# Patient Record
Sex: Female | Born: 1995 | Race: Black or African American | Hispanic: No | Marital: Married | Smoking: Former smoker
Health system: Southern US, Community
[De-identification: ages and names within clinical notes are randomized; demographics above are authoritative.]

## PROBLEM LIST (undated history)

## (undated) DIAGNOSIS — J302 Other seasonal allergic rhinitis: Secondary | ICD-10-CM

## (undated) DIAGNOSIS — Z789 Other specified health status: Secondary | ICD-10-CM

## (undated) HISTORY — PX: APPENDECTOMY: SHX54

---

## 2013-04-17 ENCOUNTER — Encounter (HOSPITAL_COMMUNITY): Payer: Self-pay | Admitting: Neurology

## 2013-04-17 ENCOUNTER — Emergency Department (HOSPITAL_COMMUNITY)
Admission: EM | Admit: 2013-04-17 | Discharge: 2013-04-17 | Disposition: A | Discharge status: Home / Self Care | Payer: Medicaid Other | Attending: Emergency Medicine | Admitting: Emergency Medicine

## 2013-04-17 DIAGNOSIS — F17200 Nicotine dependence, unspecified, uncomplicated: Secondary | ICD-10-CM | POA: Insufficient documentation

## 2013-04-17 DIAGNOSIS — R21 Rash and other nonspecific skin eruption: Secondary | ICD-10-CM | POA: Insufficient documentation

## 2013-04-17 DIAGNOSIS — R51 Headache: Secondary | ICD-10-CM | POA: Insufficient documentation

## 2013-04-17 DIAGNOSIS — L299 Pruritus, unspecified: Secondary | ICD-10-CM | POA: Insufficient documentation

## 2013-04-17 DIAGNOSIS — Z79899 Other long term (current) drug therapy: Secondary | ICD-10-CM | POA: Insufficient documentation

## 2013-04-17 DIAGNOSIS — J3489 Other specified disorders of nose and nasal sinuses: Secondary | ICD-10-CM | POA: Insufficient documentation

## 2013-04-17 HISTORY — DX: Other seasonal allergic rhinitis: J30.2

## 2013-04-17 MED ORDER — PREDNISONE 20 MG PO TABS: 40.0000 mg | ORAL_TABLET | Freq: Every day | ORAL | Status: DC

## 2013-04-17 MED ORDER — DIPHENHYDRAMINE HCL 25 MG PO TABS: 25.0000 mg | ORAL_TABLET | Freq: Four times a day (QID) | ORAL | Status: DC | PRN

## 2013-04-17 MED ORDER — FAMOTIDINE 20 MG PO TABS: 20.0000 mg | ORAL_TABLET | Freq: Two times a day (BID) | ORAL | Status: DC

## 2013-04-17 MED ORDER — CETIRIZINE-PSEUDOEPHEDRINE ER 5-120 MG PO TB12: 1.0000 | ORAL_TABLET | Freq: Two times a day (BID) | ORAL | Status: DC

## 2013-04-17 NOTE — ED Provider Notes (Signed)
Medical screening examination/treatment/procedure(s) were performed by non-physician practitioner and as supervising physician I was immediately available for consultation/collaboration.   Junius Argyle, MD 04/17/13 [phone]

## 2013-04-17 NOTE — ED Notes (Signed)
Pt reporting hives to arms and legs. Also stuffy nose, productive cough, ear pain since spring along with sinus headache. Reports moved into new apartment last week. Pt is a x 4. NAD.

## 2013-04-17 NOTE — ED Provider Notes (Signed)
CSN: [id number]     Arrival date & time 04/17/13  3299 History   First MD Initiated Contact with Patient 04/17/13 0830     Chief Complaint  Patient presents with  . Urticaria   (Consider location/radiation/quality/duration/timing/severity/associated sxs/prior Treatment) HPI Comments: Patient is a 17 year old female who presents for rash x2 days. Patient states the rash is sporadic and present on her bilateral extremities. Patient has tried Benadryl for symptoms without relief. She states the rash is pruritic. Patient denies any aggravating factors of her symptoms. Patient states that she has a history of rashes like this in the past, but that "it hasn't happened for a while". She states that usually the rash resolves spontaneously without treatment. Patient denies recent antibiotic use or new detergents, creams, lotions, or food exposures. Patient further denies associated fever, inability to swallow, shortness of breath, nausea or vomiting, or numbness/tingling. Patient also complaining of nasal congestion with intermittent cough and sinus headache x3 months. Patient states she has seen her primary care provider for the symptoms and was told she has allergies. She has been taking loratadine for symptoms with no relief.  Patient is a 17 y.o. female presenting with urticaria. The history is provided by the patient. No language interpreter was used.  Urticaria Associated symptoms include congestion, headaches and a rash. Pertinent negatives include no fever, nausea or vomiting.    Past Medical History  Diagnosis Date  . Seasonal allergies    Past Surgical History  Procedure Laterality Date  . Appendectomy     No family history on file. History  Substance Use Topics  . Smoking status: Current Every Day Smoker  . Smokeless tobacco: Not on file  . Alcohol Use: Yes   OB History   Grav Para Term Preterm Abortions TAB SAB Ect Mult Living                 Review of Systems  Constitutional:  Negative for fever.  HENT: Positive for congestion. Negative for rhinorrhea, drooling and trouble swallowing.   Respiratory: Negative for shortness of breath.   Gastrointestinal: Negative for nausea and vomiting.  Skin: Positive for rash.  Neurological: Positive for headaches.  All other systems reviewed and are negative.    Allergies  Review of patient's allergies indicates no known allergies.  Home Medications   Current Outpatient Rx  Name  Route  Sig  Dispense  Refill  . diphenhydrAMINE (SOMINEX) 25 MG tablet   Oral   Take 25 mg by mouth at bedtime as needed for sleep.         Marland Kitchen ibuprofen (ADVIL,MOTRIN) 200 MG tablet   Oral   Take 400 mg by mouth every 6 (six) hours as needed for pain.         Marland Kitchen loratadine (CLARITIN) 10 MG tablet   Oral   Take 10 mg by mouth daily.         . pseudoephedrine (SUDAFED) 60 MG tablet   Oral   Take 60 mg by mouth every 4 (four) hours as needed for congestion.         . cetirizine-pseudoephedrine (ZYRTEC-D) 5-120 MG per tablet   Oral   Take 1 tablet by mouth 2 (two) times daily.   30 tablet   0   . diphenhydrAMINE (BENADRYL) 25 MG tablet   Oral   Take 1 tablet (25 mg total) by mouth every 6 (six) hours as needed for itching (Rash).   30 tablet   0   . famotidine (  PEPCID) 20 MG tablet   Oral   Take 1 tablet (20 mg total) by mouth 2 (two) times daily.   30 tablet   0   . predniSONE (DELTASONE) 20 MG tablet   Oral   Take 2 tablets (40 mg total) by mouth daily.   10 tablet   0    BP 115/68  Pulse 90  Temp(Src) 98.7 F (37.1 C) (Oral)  Resp 20  Ht 5\' 9"  (1.753 m)  Wt 233 lb (105.688 kg)  BMI 34.39 kg/m2  SpO2 100%  LMP 03/29/2013 Physical Exam  Nursing note and vitals reviewed. Constitutional: She is oriented to person, place, and time. She appears well-developed and well-nourished. No distress.  HENT:  Head: Normocephalic and atraumatic.  Right Ear: Tympanic membrane, external ear and ear canal normal. No  mastoid tenderness.  Left Ear: Tympanic membrane, external ear and ear canal normal. No mastoid tenderness.  Nose: Nose normal.  Mouth/Throat: Uvula is midline, oropharynx is clear and moist and mucous membranes are normal. No oral lesions. No edematous. No oropharyngeal exudate or posterior oropharyngeal edema.  Eyes: Conjunctivae and EOM are normal. Pupils are equal, round, and reactive to light. No scleral icterus.  Neck: Normal range of motion. Neck supple.  Cardiovascular: Normal rate, regular rhythm, normal heart sounds and intact distal pulses.   Pulmonary/Chest: Effort normal and breath sounds normal. No stridor. No respiratory distress. She has no wheezes. She has no rales.  Musculoskeletal: Normal range of motion.  Neurological: She is alert and oriented to person, place, and time.  Skin: Skin is warm and dry. Rash noted. She is not diaphoretic. No pallor.  Sporadic, slightly raised, blanching erythematous, macular rash present on heel of LLE, L inner thing, L elbow, and R hand. No vesicles, superficial scaling, weeping, dryness, or purulent drainage.  Psychiatric: She has a normal mood and affect. Her behavior is normal.    ED Course  Procedures (including critical care time) Labs Review Labs Reviewed - No data to display Imaging Review No results found.  MDM   1. Rash    Uncomplicated macular rash - Patient well and nontoxic appearing, hemodynamically stable, and afebrile. She denies any contact with new topical products or ingesting new foods. Denies recent abx use. Airway patent. Tolerating secretions without difficulty. Patient without tachycardia, tachypnea, or hypoxia; lungs CTAB. Rash nonconcerning for SJS, erythema multiforme major, or erythema multiforme minor. Patient appropriate for discharge with primary care followup for further evaluation of symptoms. Benadryl, Pepcid, and prednisone prescribed for pruritic rash. Zyrtec-D also prescribed as loratadine not helping  allergy symptoms and nasal congestion which have been persisting x 3 months. Indications for ED return discussed and patient agreeable to plan with no unaddressed concerns.    Antonietta Breach, PA-C 04/17/13 [phone]

## 2013-06-03 ENCOUNTER — Emergency Department (HOSPITAL_COMMUNITY)
Admission: EM | Admit: 2013-06-03 | Discharge: 2013-06-03 | Disposition: A | Discharge status: Home / Self Care | Payer: Medicaid Other | Attending: Emergency Medicine | Admitting: Emergency Medicine

## 2013-06-03 ENCOUNTER — Encounter (HOSPITAL_COMMUNITY): Payer: Self-pay | Admitting: Emergency Medicine

## 2013-06-03 DIAGNOSIS — F17200 Nicotine dependence, unspecified, uncomplicated: Secondary | ICD-10-CM | POA: Insufficient documentation

## 2013-06-03 DIAGNOSIS — Z79899 Other long term (current) drug therapy: Secondary | ICD-10-CM | POA: Insufficient documentation

## 2013-06-03 DIAGNOSIS — L0291 Cutaneous abscess, unspecified: Secondary | ICD-10-CM

## 2013-06-03 DIAGNOSIS — Z8709 Personal history of other diseases of the respiratory system: Secondary | ICD-10-CM | POA: Insufficient documentation

## 2013-06-03 DIAGNOSIS — L02419 Cutaneous abscess of limb, unspecified: Secondary | ICD-10-CM | POA: Insufficient documentation

## 2013-06-03 NOTE — ED Notes (Signed)
Started 1 week ago with "boil" on right inner thigh.

## 2013-06-03 NOTE — ED Provider Notes (Signed)
CSN: [id number]     Arrival date & time 06/03/13  1225 History  This chart was scribed for non-physician practitioner Junious Silk, PA-C, working with Shelda Jakes, MD by Dorothey Baseman, ED Scribe. This patient was seen in room TR11C/TR11C and the patient's care was started at 1:25 PM.    Chief Complaint  Patient presents with  . Recurrent Skin Infections   The history is provided by the patient. No language interpreter was used.   HPI Comments: Ann Ruiz is a 17 y.o. female who presents to the Emergency Department complaining of an abscess to the right inner thigh with an associated burning pain that has been progressively worsening onset 1 week ago. Patient denies history of prior abscesses. She denies fever, chills, and abdominal pain. Palpation makes her pain worse. Nothing makes her pain better. Patient denies any other pertinent medical history.   Past Medical History  Diagnosis Date  . Seasonal allergies    Past Surgical History  Procedure Laterality Date  . Appendectomy     No family history on file. History  Substance Use Topics  . Smoking status: Current Every Day Smoker  . Smokeless tobacco: Not on file  . Alcohol Use: Yes   OB History   Grav Para Term Preterm Abortions TAB SAB Ect Mult Living                 Review of Systems  Constitutional: Negative for fever and chills.  Gastrointestinal: Negative for abdominal pain.  Skin: Positive for wound ( abscess to inner right thigh).  All other systems reviewed and are negative.    Allergies  Review of patient's allergies indicates no known allergies.  Home Medications   Current Outpatient Rx  Name  Route  Sig  Dispense  Refill  . cetirizine-pseudoephedrine (ZYRTEC-D) 5-120 MG per tablet   Oral   Take 1 tablet by mouth 2 (two) times daily.   30 tablet   0   . ibuprofen (ADVIL,MOTRIN) 200 MG tablet   Oral   Take 400 mg by mouth every 6 (six) hours as needed for pain.         Marland Kitchen loratadine  (CLARITIN) 10 MG tablet   Oral   Take 10 mg by mouth daily.          Triage Vitals: BP 113/74  Pulse 89  Temp(Src) 97.3 F (36.3 C) (Oral)  SpO2 100%  LMP 05/20/2013  Physical Exam  Nursing note and vitals reviewed. Constitutional: She is oriented to person, place, and time. She appears well-developed and well-nourished. No distress.  HENT:  Head: Normocephalic and atraumatic.  Right Ear: External ear normal.  Left Ear: External ear normal.  Nose: Nose normal.  Mouth/Throat: Oropharynx is clear and moist.  Eyes: Conjunctivae are normal.  Neck: Normal range of motion.  Cardiovascular: Normal rate, regular rhythm and normal heart sounds.   Pulmonary/Chest: Effort normal and breath sounds normal. No stridor. No respiratory distress. She has no wheezes. She has no rales.  Abdominal: Soft. She exhibits no distension.  Musculoskeletal: Normal range of motion.  Neurological: She is alert and oriented to person, place, and time. She has normal strength.  Skin: Skin is warm and dry. She is not diaphoretic. No erythema.  3 cm area of fluctuance without surrounding erythema or streaking.   Psychiatric: She has a normal mood and affect. Her behavior is normal.    ED Course  Procedures (including critical care time)  DIAGNOSTIC STUDIES: Oxygen Saturation is 100%  on room air, normal by my interpretation.    COORDINATION OF CARE: 1:27 PM- Will perform an incision and drainage of the area. Advised patient to soak the area in clean water for 15 minutes, 3 times a day and to keep the area clean until symptoms subside. Discussed that antibiotics will not be necessary. Advised patient to follow up if there are any new or worsening symptoms. Discussed treatment plan with patient at bedside and patient verbalized agreement.   INCISION AND DRAINAGE Performed by: Junious Silk, PA-C Consent: Verbal consent obtained. Risks and benefits: risks, benefits and alternatives were discussed Type:  abscess Body area: inner right thigh Anesthesia: local infiltration Incision was made with a scalpel. Local anesthetic: lidocaine 2% with epinephrine Anesthetic total: 4 ml Complexity: simple Blunt dissection to break up loculations Drainage: purulent Drainage amount: moderate Patient tolerance: Patient tolerated the procedure well with no immediate complications.   Labs Review Labs Reviewed - No data to display Imaging Review No results found.  EKG Interpretation   None       MDM   1. Abscess    Patient with skin abscess amenable to incision and drainage.  Abscess was not large enough to warrant packing or drain,  wound recheck in 2 days. Encouraged home warm soaks and flushing.  Mild signs of cellulitis is surrounding skin.  Will d/c to home.  No antibiotic therapy is indicated.   I personally performed the services described in this documentation, which was scribed in my presence. The recorded information has been reviewed and is accurate.     Mora Bellman, PA-C 06/03/13 1535

## 2013-06-05 NOTE — ED Provider Notes (Signed)
Medical screening examination/treatment/procedure(s) were performed by non-physician practitioner and as supervising physician I was immediately available for consultation/collaboration.   Shelda Jakes, MD 06/05/13 1019

## 2013-09-19 ENCOUNTER — Emergency Department (HOSPITAL_COMMUNITY): Payer: Medicaid Other

## 2013-09-19 ENCOUNTER — Encounter (HOSPITAL_COMMUNITY): Payer: Self-pay | Admitting: Emergency Medicine

## 2013-09-19 ENCOUNTER — Emergency Department (HOSPITAL_COMMUNITY)
Admission: EM | Admit: 2013-09-19 | Discharge: 2013-09-19 | Disposition: A | Discharge status: Home / Self Care | Payer: Medicaid Other | Attending: Emergency Medicine | Admitting: Emergency Medicine

## 2013-09-19 DIAGNOSIS — Z3202 Encounter for pregnancy test, result negative: Secondary | ICD-10-CM | POA: Insufficient documentation

## 2013-09-19 DIAGNOSIS — F17200 Nicotine dependence, unspecified, uncomplicated: Secondary | ICD-10-CM | POA: Insufficient documentation

## 2013-09-19 DIAGNOSIS — K5900 Constipation, unspecified: Secondary | ICD-10-CM | POA: Insufficient documentation

## 2013-09-19 LAB — URINALYSIS, ROUTINE W REFLEX MICROSCOPIC
Bilirubin Urine: NEGATIVE
GLUCOSE, UA: NEGATIVE mg/dL
HGB URINE DIPSTICK: NEGATIVE
Ketones, ur: NEGATIVE mg/dL
LEUKOCYTES UA: NEGATIVE
Nitrite: NEGATIVE
Protein, ur: NEGATIVE mg/dL
SPECIFIC GRAVITY, URINE: 1.007 (ref 1.005–1.030)
UROBILINOGEN UA: 0.2 mg/dL (ref 0.0–1.0)
pH: 8 (ref 5.0–8.0)

## 2013-09-19 LAB — COMPREHENSIVE METABOLIC PANEL
ALBUMIN: 4.1 g/dL (ref 3.5–5.2)
ALT: 8 U/L (ref 0–35)
AST: 12 U/L (ref 0–37)
Alkaline Phosphatase: 97 U/L (ref 39–117)
BUN: 7 mg/dL (ref 6–23)
CALCIUM: 9.5 mg/dL (ref 8.4–10.5)
CO2: 23 mEq/L (ref 19–32)
CREATININE: 0.68 mg/dL (ref 0.50–1.10)
Chloride: 103 mEq/L (ref 96–112)
GFR calc Af Amer: >90 mL/min (ref >90)
GFR calc non Af Amer: >90 mL/min (ref >90)
Glucose, Bld: 94 mg/dL (ref 70–99)
Potassium: 3.9 mEq/L (ref 3.7–5.3)
Sodium: 142 mEq/L (ref 137–147)
Total Bilirubin: 0.3 mg/dL (ref 0.3–1.2)
Total Protein: 7.6 g/dL (ref 6.0–8.3)

## 2013-09-19 LAB — CBC WITH DIFFERENTIAL/PLATELET
BASOS ABS: 0 10*3/uL (ref 0.0–0.1)
BASOS PCT: 0 % (ref 0–1)
EOS ABS: 0.1 10*3/uL (ref 0.0–0.7)
EOS PCT: 1 % (ref 0–5)
HEMATOCRIT: 36.6 % (ref 36.0–46.0)
Hemoglobin: 12.6 g/dL (ref 12.0–15.0)
Lymphocytes Relative: 19 % (ref 12–46)
Lymphs Abs: 1.8 10*3/uL (ref 0.7–4.0)
MCH: 31.3 pg (ref 26.0–34.0)
MCHC: 34.4 g/dL (ref 30.0–36.0)
MCV: 90.8 fL (ref 78.0–100.0)
MONO ABS: 0.6 10*3/uL (ref 0.1–1.0)
Monocytes Relative: 6 % (ref 3–12)
NEUTROS ABS: 7 10*3/uL (ref 1.7–7.7)
Neutrophils Relative %: 73 % (ref 43–77)
Platelets: 238 10*3/uL (ref 150–400)
RBC: 4.03 MIL/uL (ref 3.87–5.11)
RDW: 13 % (ref 11.5–15.5)
WBC: 9.5 10*3/uL (ref 4.0–10.5)

## 2013-09-19 LAB — POCT PREGNANCY, URINE: PREG TEST UR: NEGATIVE

## 2013-09-19 NOTE — ED Notes (Signed)
Pt to xray at this time.

## 2013-09-19 NOTE — Discharge Instructions (Signed)
Constipation, Adult °Constipation is when a person has fewer than 3 bowel movements a week; has difficulty having a bowel movement; or has stools that are dry, hard, or larger than normal. As people grow older, constipation is more common. If you try to fix constipation with medicines that make you have a bowel movement (laxatives), the problem may get worse. Long-term laxative use may cause the muscles of the colon to become weak. A low-fiber diet, not taking in enough fluids, and taking certain medicines may make constipation worse. °CAUSES  °· Certain medicines, such as antidepressants, pain medicine, iron supplements, antacids, and water pills.   °· Certain diseases, such as diabetes, irritable bowel syndrome (IBS), thyroid disease, or depression.   °· Not drinking enough water.   °· Not eating enough fiber-rich foods.   °· Stress or travel. °· Lack of physical activity or exercise. °· Not going to the restroom when there is the urge to have a bowel movement. °· Ignoring the urge to have a bowel movement. °· Using laxatives too much. °SYMPTOMS  °· Having fewer than 3 bowel movements a week.   °· Straining to have a bowel movement.   °· Having hard, dry, or larger than normal stools.   °· Feeling full or bloated.   °· Pain in the lower abdomen. °· Not feeling relief after having a bowel movement. °DIAGNOSIS  °Your caregiver will take a medical history and perform a physical exam. Further testing may be done for severe constipation. Some tests may include:  °· A barium enema X-ray to examine your rectum, colon, and sometimes, your small intestine. °· A sigmoidoscopy to examine your lower colon. °· A colonoscopy to examine your entire colon. °TREATMENT  °Treatment will depend on the severity of your constipation and what is causing it. Some dietary treatments include drinking more fluids and eating more fiber-rich foods. Lifestyle treatments may include regular exercise. If these diet and lifestyle recommendations  do not help, your caregiver may recommend taking over-the-counter laxative medicines to help you have bowel movements. Prescription medicines may be prescribed if over-the-counter medicines do not work.  °HOME CARE INSTRUCTIONS  °· Increase dietary fiber in your diet, such as fruits, vegetables, whole grains, and beans. Limit high-fat and processed sugars in your diet, such as French fries, hamburgers, cookies, candies, and soda.   °· A fiber supplement may be added to your diet if you cannot get enough fiber from foods.   °· Drink enough fluids to keep your urine clear or pale yellow.   °· Exercise regularly or as directed by your caregiver.   °· Go to the restroom when you have the urge to go. Do not hold it. °· Only take medicines as directed by your caregiver. Do not take other medicines for constipation without talking to your caregiver first. °SEEK IMMEDIATE MEDICAL CARE IF:  °· You have bright red blood in your stool.   °· Your constipation lasts for more than 4 days or gets worse.   °· You have abdominal or rectal pain.   °· You have thin, pencil-like stools. °· You have unexplained weight loss. °MAKE SURE YOU:  °· Understand these instructions. °· Will watch your condition. °· Will get help right away if you are not doing well or get worse. °Document Released: 05/06/2004 Document Revised: 10/31/2011 Document Reviewed: 05/20/2013 °[employer]® Patient Information ©2014 [employer]. ° °Fiber Content in Foods °Drinking plenty of fluids and consuming foods high in fiber can help with constipation. See the list below for the fiber content of some common foods. °Starches and Grains / Dietary   Fiber (g) °· Cheerios, 1 cup / 3 g °· Kellogg's Corn Flakes, 1 cup / 0.7 g °· Rice Krispies, 1 ¼ cup / 0.3 g °· Quaker Oat Life Cereal, ¾ cup / 2.1 g °· Oatmeal, instant (cooked), ½ cup / 2 g °· Kellogg's Frosted Mini Wheats, 1 cup / 5.1 g °· Rice, brown, long-grain (cooked), 1 cup / 3.5 g °· Rice, white, long-grain (cooked),  1 cup / 0.6 g °· Macaroni, cooked, enriched, 1 cup / 2.5 g °Legumes / Dietary Fiber (g) °· Beans, baked, canned, plain or vegetarian, ½ cup / 5.2 g °· Beans, kidney, canned, ½ cup / 6.8 g °· Beans, pinto, dried (cooked), ½ cup / 7.7 g °· Beans, pinto, canned, ½ cup / 5.5 g °Breads and Crackers / Dietary Fiber (g) °· Graham crackers, plain or honey, 2 squares / 0.7 g °· Saltine crackers, 3 squares / 0.3 g °· Pretzels, plain, salted, 10 pieces / 1.8 g °· Bread, whole-wheat, 1 slice / 1.9 g °· Bread, white, 1 slice / 0.7 g °· Bread, raisin, 1 slice / 1.2 g °· Bagel, plain, 3 oz / 2 g °· Tortilla, flour, 1 oz / 0.9 g °· Tortilla, corn, 1 small / 1.5 g °· Bun, hamburger or hotdog, 1 small / 0.9 g °Fruits / Dietary Fiber (g) °· Apple, raw with skin, 1 medium / 4.4 g °· Applesauce, sweetened, ½ cup / 1.5 g °· Banana, ½ medium / 1.5 g °· Grapes, 10 grapes / 0.4 g °· Orange, 1 small / 2.3 g °· Raisin, 1.5 oz / 1.6 g °· Melon, 1 cup / 1.4 g °Vegetables / Dietary Fiber (g) °· Green beans, canned, ½ cup / 1.3 g °· Carrots (cooked), ½ cup / 2.3 g °· Broccoli (cooked), ½ cup / 2.8 g °· Peas, frozen (cooked), ½ cup / 4.4 g °· Potatoes, mashed, ½ cup / 1.6 g °· Lettuce, 1 cup / 0.5 g °· Corn, canned, ½ cup / 1.6 g °· Tomato, ½ cup / 1.1 g °Document Released: 12/25/2006 Document Revised: 10/31/2011 Document Reviewed: 02/19/2007 °ExitCare® Patient Information ©2014 [employer]. ° °

## 2013-09-19 NOTE — ED Notes (Signed)
Pt up to bathroom without any problems 

## 2013-09-19 NOTE — ED Notes (Signed)
Pt reports severe lower abd cramping that started today, having difficulty having bowel movement. Denies any urinary or vaginal symptoms. Denies n/v.

## 2013-09-19 NOTE — ED Provider Notes (Signed)
TIME SEEN: 4:56 PM  CHIEF COMPLAINT: Abdominal pain  HPI: Patient is a 18 year old female with no significant past medical history who presents emergency department with lower abdominal cramping pain that started last night. Patient reports that she has not had a normal bowel movement in over a week. She states she normally has a bowel movement everyday. She did have 2 bowel movements last night she states she had to strain to get them out. They were slightly loose initially but then were harder. She has been passing gas. She did have a bowel movement this morning with no relief of her symptoms. She took of magnesia prior to coming to the emergency department. She denies any fevers, chills, nausea or vomiting, dysuria or hematuria, vaginal bleeding or discharge. Her last menstrual period was last week. She is sexually active. She is status post appendectomy in 2011. No sick contacts. No recent travel.  ROS: See HPI Constitutional: no fever  Eyes: no drainage  ENT: no runny nose   Cardiovascular:  no chest pain  Resp: no SOB  GI: no vomiting GU: no dysuria Integumentary: no rash  Allergy: no hives  Musculoskeletal: no leg swelling  Neurological: no slurred speech ROS otherwise negative  PAST MEDICAL HISTORY/PAST SURGICAL HISTORY:  Past Medical History  Diagnosis Date  . Seasonal allergies     MEDICATIONS:  Prior to Admission medications   Medication Sig Start Date End Date Taking? Authorizing Provider  fluticasone (FLONASE) 50 MCG/ACT nasal spray Place 1 spray into both nostrils daily as needed for allergies or rhinitis.   Yes Historical Provider, MD  ibuprofen (ADVIL,MOTRIN) 200 MG tablet Take 400 mg by mouth every 6 (six) hours as needed for pain.   Yes Historical Provider, MD    ALLERGIES:  No Known Allergies  SOCIAL HISTORY:  History  Substance Use Topics  . Smoking status: Current Every Day Smoker  . Smokeless tobacco: Not on file  . Alcohol Use: Yes    FAMILY  HISTORY: History reviewed. No pertinent family history.  EXAM: BP 120/64  Pulse 100  Temp(Src) 97.8 F (36.6 C) (Oral)  Resp 22  SpO2 98%  LMP 08/29/2013 CONSTITUTIONAL: Alert and oriented and responds appropriately to questions. Well-appearing; well-nourished HEAD: Normocephalic EYES: Conjunctivae clear, PERRL ENT: normal nose; no rhinorrhea; moist mucous membranes; pharynx without lesions noted NECK: Supple, no meningismus, no LAD  CARD: RRR; S1 and S2 appreciated; no murmurs, no clicks, no rubs, no gallops RESP: Normal chest excursion without splinting or tachypnea; breath sounds clear and equal bilaterally; no wheezes, no rhonchi, no rales,  ABD/GI: Normal bowel sounds; non-distended; soft, tender to palpation across the lower abdomen, no peritoneal signs, abdomen is not tympanitic, no rebound, no guarding BACK:  The back appears normal and is non-tender to palpation, there is no CVA tenderness EXT: Normal ROM in all joints; non-tender to palpation; no edema; normal capillary refill; no cyanosis    SKIN: Normal color for age and race; warm NEURO: Moves all extremities equally PSYCH: The patient's mood and manner are appropriate. Grooming and personal hygiene are appropriate.  MEDICAL DECISION MAKING: She is here with likely constipation. Her labs drawn in triage are unremarkable. Urine pregnancy test negative. She reports feeling the need to have another bowel movement. Will reassess once patient has gone to the restroom. She denies wanting an enema in the ED today. We'll obtain x-ray to rule out obstruction given patient has had prior history of abdominal surgery.  ED PROGRESS: Patient reports feeling much better  after 2 bowel movements in the emergency department. Her abdominal pain is now gone. Her x-ray shows no obstruction, free air. There is a mild gaseous distention of a solitary loop of small bowel that may be normal versus focal enteritis. Given patient's abdominal exam is now  completely benign, do not feel she needs further imaging. We'll discharge him with return precautions and supportive care instructions.  Have instructed patient to start using daily MiraLax. Patient verbalizes understanding and is comfortable plan.     Layla Maw Ward, DO 09/19/13 1820

## 2013-12-31 ENCOUNTER — Encounter (HOSPITAL_COMMUNITY): Payer: Self-pay | Admitting: Emergency Medicine

## 2013-12-31 ENCOUNTER — Emergency Department (HOSPITAL_COMMUNITY)
Admission: EM | Admit: 2013-12-31 | Discharge: 2013-12-31 | Disposition: A | Discharge status: Home / Self Care | Payer: Medicaid Other | Attending: Emergency Medicine | Admitting: Emergency Medicine

## 2013-12-31 DIAGNOSIS — R059 Cough, unspecified: Secondary | ICD-10-CM | POA: Insufficient documentation

## 2013-12-31 DIAGNOSIS — R05 Cough: Secondary | ICD-10-CM | POA: Insufficient documentation

## 2013-12-31 DIAGNOSIS — J329 Chronic sinusitis, unspecified: Secondary | ICD-10-CM

## 2013-12-31 DIAGNOSIS — IMO0002 Reserved for concepts with insufficient information to code with codable children: Secondary | ICD-10-CM | POA: Insufficient documentation

## 2013-12-31 DIAGNOSIS — H5710 Ocular pain, unspecified eye: Secondary | ICD-10-CM | POA: Insufficient documentation

## 2013-12-31 DIAGNOSIS — F17200 Nicotine dependence, unspecified, uncomplicated: Secondary | ICD-10-CM | POA: Insufficient documentation

## 2013-12-31 DIAGNOSIS — Z791 Long term (current) use of non-steroidal anti-inflammatories (NSAID): Secondary | ICD-10-CM | POA: Insufficient documentation

## 2013-12-31 MED ORDER — AZITHROMYCIN 250 MG PO TABS: 250.0000 mg | ORAL_TABLET | Freq: Every day | ORAL | Status: DC

## 2013-12-31 MED ORDER — LORATADINE-PSEUDOEPHEDRINE ER 5-120 MG PO TB12: 1.0000 | ORAL_TABLET | Freq: Two times a day (BID) | ORAL | Status: DC

## 2013-12-31 NOTE — Discharge Instructions (Signed)
Take azithromycin as directed until gone. Take Claritin daily for allergy relief. Refer to attached documents for more information.

## 2013-12-31 NOTE — ED Provider Notes (Signed)
CSN: [id number]     Arrival date & time 12/31/13  [id number] History  This chart was scribed for non-physician practitioner working with Shanna CiscoMegan E Docherty, MD, by Jarvis Morganaylor Ferguson, ED Scribe. This patient was seen in room TR10C/TR10C and the patient's care was started at 10:50 AM.   Chief Complaint  Patient presents with  . Facial Pain     The history is provided by the patient. No language interpreter was used.   HPI Comments: Ann Ruiz is a 18 y.o. female who presents to the Emergency Department complaining of gradually worsening, intermittent, sinus congestion onset 3 weeks ago. Patient states she has been having associated sinus pressure, ear pain, cough and headache. Patient states she has been taking Excedrin Migraine to help with the facial pain and headaches with mild relief. Patient also notes that she feels like she has a "swollen lymph node under her chin" Patient denies any fever or nausea.   Past Medical History  Diagnosis Date  . Seasonal allergies    Past Surgical History  Procedure Laterality Date  . Appendectomy     No family history on file. History  Substance Use Topics  . Smoking status: Current Every Day Smoker  . Smokeless tobacco: Not on file  . Alcohol Use: Yes   OB History   Grav Para Term Preterm Abortions TAB SAB Ect Mult Living                 Review of Systems  Constitutional: Negative for fever.  HENT: Positive for congestion and sinus pressure.   Eyes: Positive for pain.  Respiratory: Positive for cough.   Neurological: Positive for headaches.  All other systems reviewed and are negative.     Allergies  Review of patient's allergies indicates no known allergies.  Home Medications   Prior to Admission medications   Medication Sig Start Date End Date Taking? Authorizing Provider  fluticasone (FLONASE) 50 MCG/ACT nasal spray Place 1 spray into both nostrils daily as needed for allergies or rhinitis.    Historical Provider, MD  ibuprofen  (ADVIL,MOTRIN) 200 MG tablet Take 400 mg by mouth every 6 (six) hours as needed for pain.    Historical Provider, MD   Triage Vitals: BP 106/74  Pulse 85  Temp(Src) 97.7 F (36.5 C) (Oral)  Resp 16  SpO2 100%  Physical Exam  Nursing note and vitals reviewed. Constitutional: She is oriented to person, place, and time. She appears well-developed and well-nourished. No distress.  HENT:  Head: Normocephalic and atraumatic.  Right Ear: External ear normal.  Left Ear: External ear normal.  Nose: Nose normal.  Mouth/Throat: Oropharynx is clear and moist.  Eyes: Conjunctivae are normal.  Neck: Normal range of motion. Neck supple.  Cardiovascular: Normal rate.   Pulmonary/Chest: Effort normal.  Abdominal: Soft.  Musculoskeletal: Normal range of motion.  Neurological: She is alert and oriented to person, place, and time.  Skin: Skin is warm and dry. She is not diaphoretic.  Psychiatric: She has a normal mood and affect.    ED Course  Procedures (including critical care time)  DIAGNOSTIC STUDIES: Oxygen Saturation is 100% on RA, normal by my interpretation.    COORDINATION OF CARE: 10:56 AM- Will discharge with Zithromax and Claritin. Pt advised of plan for treatment and pt agrees.    Labs Review Labs Reviewed - No data to display  Imaging Review No results found.   EKG Interpretation None      MDM   Final diagnoses:  Sinusitis  Patient will have azithromycin for sinusitis and claritin for allergies. Vitals stable and patient afebrile.   I personally performed the services described in this documentation, which was scribed in my presence. The recorded information has been reviewed and is accurate.   Emilia BeckKaitlyn Megean Fabio, PA-C 12/31/13 1220

## 2013-12-31 NOTE — ED Notes (Signed)
Pt c/o "sinus problems" x 1 year. States has taken OTC allergy meds from time to time but not currently. C/o sinus pressure and "a lymph node swollen under my chin".

## 2013-12-31 NOTE — ED Provider Notes (Signed)
Medical screening examination/treatment/procedure(s) were performed by non-physician practitioner and as supervising physician I was immediately available for consultation/collaboration.    Clothilde Tippetts E Carsen Leaf, MD 12/31/13 2213 

## 2015-11-16 ENCOUNTER — Emergency Department (HOSPITAL_COMMUNITY)
Admission: EM | Admit: 2015-11-16 | Discharge: 2015-11-16 | Disposition: A | Discharge status: Home / Self Care | Payer: Medicaid Other | Attending: Emergency Medicine | Admitting: Emergency Medicine

## 2015-11-16 ENCOUNTER — Encounter (HOSPITAL_COMMUNITY): Payer: Self-pay | Admitting: Emergency Medicine

## 2015-11-16 DIAGNOSIS — F17200 Nicotine dependence, unspecified, uncomplicated: Secondary | ICD-10-CM | POA: Insufficient documentation

## 2015-11-16 DIAGNOSIS — R197 Diarrhea, unspecified: Secondary | ICD-10-CM | POA: Insufficient documentation

## 2015-11-16 DIAGNOSIS — R112 Nausea with vomiting, unspecified: Secondary | ICD-10-CM | POA: Insufficient documentation

## 2015-11-16 DIAGNOSIS — Z3202 Encounter for pregnancy test, result negative: Secondary | ICD-10-CM | POA: Insufficient documentation

## 2015-11-16 LAB — URINALYSIS, ROUTINE W REFLEX MICROSCOPIC
BILIRUBIN URINE: NEGATIVE
GLUCOSE, UA: NEGATIVE mg/dL
HGB URINE DIPSTICK: NEGATIVE
Ketones, ur: NEGATIVE mg/dL
Leukocytes, UA: NEGATIVE
Nitrite: NEGATIVE
PROTEIN: 30 mg/dL — AB
Specific Gravity, Urine: 1.023 (ref 1.005–1.030)
pH: 8.5 — ABNORMAL HIGH (ref 5.0–8.0)

## 2015-11-16 LAB — COMPREHENSIVE METABOLIC PANEL
ALT: 17 U/L (ref 14–54)
AST: 20 U/L (ref 15–41)
Albumin: 3.9 g/dL (ref 3.5–5.0)
Alkaline Phosphatase: 77 U/L (ref 38–126)
Anion gap: 10 (ref 5–15)
BILIRUBIN TOTAL: 0.6 mg/dL (ref 0.3–1.2)
BUN: 11 mg/dL (ref 6–20)
CALCIUM: 9 mg/dL (ref 8.9–10.3)
CO2: 22 mmol/L (ref 22–32)
CREATININE: 0.75 mg/dL (ref 0.44–1.00)
Chloride: 107 mmol/L (ref 101–111)
GFR calc non Af Amer: >60 mL/min (ref >60)
GLUCOSE: 108 mg/dL — AB (ref 65–99)
Potassium: 4.1 mmol/L (ref 3.5–5.1)
Sodium: 139 mmol/L (ref 135–145)
Total Protein: 6.8 g/dL (ref 6.5–8.1)

## 2015-11-16 LAB — URINE MICROSCOPIC-ADD ON

## 2015-11-16 LAB — CBC
HCT: 39 % (ref 36.0–46.0)
Hemoglobin: 12.7 g/dL (ref 12.0–15.0)
MCH: 30.3 pg (ref 26.0–34.0)
MCHC: 32.6 g/dL (ref 30.0–36.0)
MCV: 93.1 fL (ref 78.0–100.0)
PLATELETS: 226 10*3/uL (ref 150–400)
RBC: 4.19 MIL/uL (ref 3.87–5.11)
RDW: 12.9 % (ref 11.5–15.5)
WBC: 10.6 10*3/uL — ABNORMAL HIGH (ref 4.0–10.5)

## 2015-11-16 LAB — LIPASE, BLOOD: LIPASE: 19 U/L (ref 11–51)

## 2015-11-16 LAB — POC URINE PREG, ED: PREG TEST UR: NEGATIVE

## 2015-11-16 MED ORDER — ONDANSETRON HCL 4 MG PO TABS: 4.0000 mg | ORAL_TABLET | Freq: Four times a day (QID) | ORAL | Status: DC

## 2015-11-16 MED ORDER — ONDANSETRON HCL 4 MG/2ML IJ SOLN
4.0000 mg | Freq: Once | INTRAMUSCULAR | Status: AC | PRN
  Administered 2015-11-16: 4 mg via INTRAVENOUS
  Filled 2015-11-16: qty 2

## 2015-11-16 MED ORDER — SODIUM CHLORIDE 0.9 % IV BOLUS (SEPSIS)
1000.0000 mL | Freq: Once | INTRAVENOUS | Status: AC
  Administered 2015-11-16: 1000 mL via INTRAVENOUS

## 2015-11-16 NOTE — Discharge Instructions (Signed)

## 2015-11-16 NOTE — ED Provider Notes (Signed)
CSN: [id number]     Arrival date & time 11/16/15  0550 History   First MD Initiated Contact with Patient 11/16/15 [phone]     Chief Complaint  Patient presents with  . Nausea  . Emesis  . Diarrhea     (Consider location/radiation/quality/duration/timing/severity/associated sxs/prior Treatment) HPI   Ann Ruiz is a 20 y.o. female with no significant PMH who presents with sudden onset, intermittent, unchanged N/V/D since approximately 11 PM last night.  She reports she has had 6 episodes of NBNB emesis with the last being approximately 30 minutes PTA and about 3-4 episodes of NB diarrhea.  She states she has not been able to tolerate PO.  Associated symptoms include chills and generalized abdominal pain.  Denies coffee ground emesis, hematemesis, melena, hematochezia, fever, cough, SOB, CP, dizziness, headache, or urinary symptoms.  No sick contacts.  Denies any other chronic medical conditions.  Denies illicit drug use.  She report she "drank about a shot last night".  She smokes about 2-3 cigarettes a day.    Past Medical History  Diagnosis Date  . Seasonal allergies    Past Surgical History  Procedure Laterality Date  . Appendectomy     No family history on file. Social History  Substance Use Topics  . Smoking status: Current Every Day Smoker  . Smokeless tobacco: None  . Alcohol Use: Yes   OB History    No data available     Review of Systems All other systems negative unless otherwise stated in HPI    Allergies  Review of patient's allergies indicates no known allergies.  Home Medications   Prior to Admission medications   Medication Sig Start Date End Date Taking? Authorizing Provider  azithromycin (ZITHROMAX Z-PAK) 250 MG tablet Take 1 tablet (250 mg total) by mouth daily.  PO day 1, then  PO days 205 Patient not taking: Reported on 11/16/2015 12/31/13   Emilia Beck, PA-C  loratadine-pseudoephedrine (CLARITIN-D 12 HOUR) 5-120 MG per tablet Take 1  tablet by mouth 2 (two) times daily. Patient not taking: Reported on 11/16/2015 12/31/13   Emilia Beck, PA-C   BP 120/82 mmHg  Pulse 92  Temp(Src) 98 F (36.7 C) (Oral)  Resp 18  Ht  (1.753 m)  Wt 106.595 kg  BMI 34.69 kg/m2  SpO2 100%  LMP 10/21/2015 (Approximate) Physical Exam  Constitutional: She is oriented to person, place, and time. She appears well-developed and well-nourished.  Non-toxic appearance. She does not have a sickly appearance. She does not appear ill.  HENT:  Head: Normocephalic and atraumatic.  Mouth/Throat: Oropharynx is clear and moist.  Eyes: Conjunctivae are normal. Pupils are equal, round, and reactive to light.  Neck: Normal range of motion. Neck supple.  Cardiovascular: Normal rate, regular rhythm and normal heart sounds.   No murmur heard. Pulmonary/Chest: Effort normal and breath sounds normal. No accessory muscle usage or stridor. No respiratory distress. She has no wheezes. She has no rhonchi. She has no rales.  Abdominal: Soft. Bowel sounds are normal. She exhibits no distension. There is no tenderness.  Musculoskeletal: Normal range of motion.  Lymphadenopathy:    She has no cervical adenopathy.  Neurological: She is alert and oriented to person, place, and time.  Speech clear without dysarthria.  Skin: Skin is warm and dry.  Psychiatric: She has a normal mood and affect. Her behavior is normal.    ED Course  Procedures (including critical care time) Labs Review Labs Reviewed  COMPREHENSIVE METABOLIC PANEL - Abnormal;  Notable for the following:    Glucose, Bld 108 (*)    All other components within normal limits  CBC - Abnormal; Notable for the following:    WBC 10.6 (*)    All other components within normal limits  URINALYSIS, ROUTINE W REFLEX MICROSCOPIC (NOT AT [facility]) - Abnormal; Notable for the following:    APPearance CLOUDY (*)    pH 8.5 (*)    Protein, ur 30 (*)    All other components within normal limits  URINE  MICROSCOPIC-ADD ON - Abnormal; Notable for the following:    Squamous Epithelial / LPF 0-5 (*)    Bacteria, UA FEW (*)    All other components within normal limits  LIPASE, BLOOD  POC URINE PREG, ED    Imaging Review No results found. I have personally reviewed and evaluated these images and lab results as part of my medical decision-making.   EKG Interpretation None      MDM   Final diagnoses:  Non-intractable vomiting with nausea, vomiting of unspecified type  Diarrhea, unspecified type   Patient presents with non-bloody N/V/D since 11 PM yesterday.  Associated symptoms include generalized abdominal pain.  No fever, CP, SOB, flank pain, or urinary symptoms.  On exam, heart RRR, lungs CTAB, abdomen soft and benign.  Moist mucous membranes.  Doubt acute intra-abdominal etiology.  Likely viral illness.  Will check labs, give IVF, and zofran.  Urine pregnancy negative, UA without signs of infection--no leukocytes, few bacteria, negative nitrite. CBC and CMP without acute abnormalities.  High suspicion for gastroenteritis.  Patient reports improvement with Zofran.  Will PO challenge.  Patient able to tolerate PO intake without difficulty.  Plan to discharge home with Zofran.  Recommend Immodium for diarrhea.  Discussed return precautions. Follow up PCP. Patient agrees and acknowledges the above plan for discharge.     Cheri FowlerKayla Dillion Stowers, PA-C 11/16/15 [id number]  Linwood DibblesJon Knapp, MD 11/16/15 [phone]

## 2015-11-16 NOTE — ED Notes (Signed)
Patient with N/V/D since 11pm last night.  Patient states that one hour after eating spaghetti and garlic bread that this started.  Patient last vomited and had diarrhea was about 30 mins PTA.  Patient continues with nausea.  Husband states that he ate at the same time, does not feel sick at this time.

## 2016-10-03 ENCOUNTER — Encounter (HOSPITAL_COMMUNITY): Payer: Self-pay

## 2016-10-03 DIAGNOSIS — O219 Vomiting of pregnancy, unspecified: Secondary | ICD-10-CM | POA: Diagnosis not present

## 2016-10-03 DIAGNOSIS — F17200 Nicotine dependence, unspecified, uncomplicated: Secondary | ICD-10-CM | POA: Insufficient documentation

## 2016-10-03 DIAGNOSIS — O99331 Smoking (tobacco) complicating pregnancy, first trimester: Secondary | ICD-10-CM | POA: Insufficient documentation

## 2016-10-03 DIAGNOSIS — Z3A01 Less than 8 weeks gestation of pregnancy: Secondary | ICD-10-CM | POA: Diagnosis not present

## 2016-10-03 DIAGNOSIS — O23591 Infection of other part of genital tract in pregnancy, first trimester: Secondary | ICD-10-CM | POA: Diagnosis not present

## 2016-10-03 DIAGNOSIS — O0281 Inappropriate change in quantitative human chorionic gonadotropin (hCG) in early pregnancy: Secondary | ICD-10-CM | POA: Insufficient documentation

## 2016-10-03 LAB — HCG, QUANTITATIVE, PREGNANCY: HCG, BETA CHAIN, QUANT, S: 22841 m[IU]/mL — AB (ref <5)

## 2016-10-03 MED ORDER — SODIUM CHLORIDE 0.9 % IV SOLN
Freq: Once | INTRAVENOUS | Status: AC
  Administered 2016-10-04: 02:00:00 via INTRAVENOUS

## 2016-10-03 MED ORDER — METOCLOPRAMIDE HCL 5 MG/ML IJ SOLN
10.0000 mg | Freq: Once | INTRAMUSCULAR | Status: AC
  Administered 2016-10-04: 10 mg via INTRAVENOUS
  Filled 2016-10-03: qty 2

## 2016-10-03 NOTE — ED Triage Notes (Signed)
Pt complaining of generalized weakness and nausea x 2 weeks. Pt states recently found out pregnant. Pt unsure of exact date. LMS beginning of January. Pt denies any vaginal bleeding or discharge. Pt states emesis x 2 today.

## 2016-10-04 ENCOUNTER — Emergency Department (HOSPITAL_COMMUNITY)
Admission: EM | Admit: 2016-10-04 | Discharge: 2016-10-04 | Disposition: A | Discharge status: Home / Self Care | Payer: BLUE CROSS/BLUE SHIELD | Attending: Emergency Medicine | Admitting: Emergency Medicine

## 2016-10-04 DIAGNOSIS — N760 Acute vaginitis: Secondary | ICD-10-CM

## 2016-10-04 DIAGNOSIS — O219 Vomiting of pregnancy, unspecified: Secondary | ICD-10-CM | POA: Diagnosis not present

## 2016-10-04 DIAGNOSIS — B9689 Other specified bacterial agents as the cause of diseases classified elsewhere: Secondary | ICD-10-CM

## 2016-10-04 LAB — CBC
HEMATOCRIT: 36.6 % (ref 36.0–46.0)
Hemoglobin: 12.3 g/dL (ref 12.0–15.0)
MCH: 31 pg (ref 26.0–34.0)
MCHC: 33.6 g/dL (ref 30.0–36.0)
MCV: 92.2 fL (ref 78.0–100.0)
Platelets: 246 10*3/uL (ref 150–400)
RBC: 3.97 MIL/uL (ref 3.87–5.11)
RDW: 12.5 % (ref 11.5–15.5)
WBC: 9 10*3/uL (ref 4.0–10.5)

## 2016-10-04 LAB — BASIC METABOLIC PANEL
Anion gap: 10 (ref 5–15)
BUN: 5 mg/dL — AB (ref 6–20)
CO2: 22 mmol/L (ref 22–32)
Calcium: 9.3 mg/dL (ref 8.9–10.3)
Chloride: 104 mmol/L (ref 101–111)
Creatinine, Ser: 0.68 mg/dL (ref 0.44–1.00)
GFR calc Af Amer: >60 mL/min (ref >60)
GFR calc non Af Amer: >60 mL/min (ref >60)
Glucose, Bld: 88 mg/dL (ref 65–99)
POTASSIUM: 3.6 mmol/L (ref 3.5–5.1)
SODIUM: 136 mmol/L (ref 135–145)

## 2016-10-04 LAB — URINALYSIS, ROUTINE W REFLEX MICROSCOPIC
Bilirubin Urine: NEGATIVE
Glucose, UA: NEGATIVE mg/dL
Hgb urine dipstick: NEGATIVE
KETONES UR: 80 mg/dL — AB
Leukocytes, UA: NEGATIVE
Nitrite: NEGATIVE
PH: 6 (ref 5.0–8.0)
Protein, ur: 100 mg/dL — AB
SPECIFIC GRAVITY, URINE: 1.029 (ref 1.005–1.030)

## 2016-10-04 LAB — HEPATIC FUNCTION PANEL
ALK PHOS: 71 U/L (ref 38–126)
ALT: 12 U/L — ABNORMAL LOW (ref 14–54)
AST: 16 U/L (ref 15–41)
Albumin: 4.2 g/dL (ref 3.5–5.0)
BILIRUBIN TOTAL: 0.6 mg/dL (ref 0.3–1.2)
Bilirubin, Direct: <0.1 mg/dL — ABNORMAL LOW (ref 0.1–0.5)
TOTAL PROTEIN: 7.3 g/dL (ref 6.5–8.1)

## 2016-10-04 LAB — LIPASE, BLOOD: LIPASE: 13 U/L (ref 11–51)

## 2016-10-04 LAB — WET PREP, GENITAL
SPERM: NONE SEEN
TRICH WET PREP: NONE SEEN
YEAST WET PREP: NONE SEEN

## 2016-10-04 LAB — GC/CHLAMYDIA PROBE AMP (~~LOC~~) NOT AT ARMC
Chlamydia: NEGATIVE
Neisseria Gonorrhea: NEGATIVE

## 2016-10-04 MED ORDER — METOCLOPRAMIDE HCL 10 MG PO TABS: 10.0000 mg | ORAL_TABLET | Freq: Four times a day (QID) | ORAL | 0 refills | Status: DC | PRN

## 2016-10-04 MED ORDER — METRONIDAZOLE 500 MG PO TABS: 500.0000 mg | ORAL_TABLET | Freq: Two times a day (BID) | ORAL | 0 refills | Status: DC

## 2016-10-04 MED ORDER — ACETAMINOPHEN 500 MG PO TABS
1000.0000 mg | ORAL_TABLET | Freq: Once | ORAL | Status: AC
  Administered 2016-10-04: 1000 mg via ORAL
  Filled 2016-10-04: qty 2

## 2016-10-04 MED ORDER — METRONIDAZOLE 500 MG PO TABS
500.0000 mg | ORAL_TABLET | Freq: Once | ORAL | Status: AC
  Administered 2016-10-04: 500 mg via ORAL
  Filled 2016-10-04: qty 1

## 2016-10-04 MED ORDER — SODIUM CHLORIDE 0.9 % IV BOLUS (SEPSIS)
1000.0000 mL | Freq: Once | INTRAVENOUS | Status: AC
  Administered 2016-10-04: 1000 mL via INTRAVENOUS

## 2016-10-04 NOTE — Discharge Instructions (Addendum)
You may take Tylenol 1000 mg every 6 hours as needed for pain. Please avoid aspirin, ibuprofen, Aleve as these are not safe in pregnancy. Please increase her water intake and follow-up with your OB/GYN.  If you develop severe lower abdominal pain, have vaginal bleeding, vomiting and cannot stop, fever, please return to the hospital.    To find a primary care or specialty doctor please call [phone] or [phone] to access "[facility] Find a Doctor Service."  You may also go on the [facility] website at [email or web address]  There are also multiple [facility] practices throughout [place] that are frequently accepting new patients. You may find a clinic that is close to your home and contact them.  [facility] -  [address] [place] [phone]   [facility] -  [address] [place] [address] [phone]   [facility] - [address] [place] [address]  [place] [id number] [phone]     [facility] [email or web address] [address] [place], [place] [phone]    [facility] [email or web address] [address] [place], [place] [address] [phone]    [facility] [address] [place], [place] [address]31 [phone]   [facility] [email or web address] [address] [place], [place] [phone]   [facility] [email or web address] [address] [place], [place] [phone]   [facility] [email or web address] [address] [place], [place] [phone]

## 2016-10-04 NOTE — ED Notes (Signed)
Pelvic cart at bedside. 

## 2016-10-04 NOTE — ED Notes (Signed)
Called lab to add on cbc bmp

## 2016-10-04 NOTE — ED Provider Notes (Signed)
By signing my name below, I, Avnee Patel, attest that this documentation has been prepared under the direction and in the presence of Layla Maw Ward, DO  Electronically Signed: Clovis Pu, ED Scribe. 10/04/16. 1:41 AM.  TIME SEEN: 1:27 AM  CHIEF COMPLAINT:  Chief Complaint  Patient presents with  . Emesis During Pregnancy  . Fatigue    HPI:  Ann Ruiz is a 21 y.o. female, with a PSHx of appendectomy 2011, who presents to the Emergency Department complaining of fatigue onset 1 week. Pt also reports nausea, 1 episode of vomiting, 1 episode of gagging, headache, subjective fever, SOB and lower abdominal painThat she describes as a cramping for the past several days. Pt states she took a pregnancy test 8 days ago and found out she was pregnant. She reports the first day of her last period was in the first week of 08/2016 but she is not sure of the exact date. No alleviating factors noted. Pt denies diarrhea, cough, chills, dysuria, hematuria, vaginal bleeding, vaginal discharge or any other associated symptoms. Pt also denies a hx of STDs, past pregnancies and reports she is currently not followed by an OBGYN. No history of PE or DVT. No history of ACS.  ROS: See HPI Constitutional: no fever  Eyes: no drainage  ENT: no runny nose   Cardiovascular:  no chest pain  Resp: + SOB  GI: + vomiting GU: no dysuria Integumentary: no rash  Allergy: no hives  Musculoskeletal: no leg swelling  Neurological: no slurred speech ROS otherwise negative  PAST MEDICAL HISTORY/PAST SURGICAL HISTORY:  Past Medical History:  Diagnosis Date  . Seasonal allergies     MEDICATIONS:  Prior to Admission medications   Medication Sig Start Date End Date Taking? Authorizing Provider  azithromycin (ZITHROMAX Z-PAK) 250 MG tablet Take 1 tablet (250 mg total) by mouth daily. 500mg  PO day 1, then 250mg  PO days 205 Patient not taking: Reported on 11/16/2015 12/31/13   Emilia Beck, PA-C   loratadine-pseudoephedrine (CLARITIN-D 12 HOUR) 5-120 MG per tablet Take 1 tablet by mouth 2 (two) times daily. Patient not taking: Reported on 11/16/2015 12/31/13   Emilia Beck, PA-C  ondansetron (ZOFRAN) 4 MG tablet Take 1 tablet (4 mg total) by mouth every 6 (six) hours. 11/16/15   Cheri Fowler, PA-C    ALLERGIES:  No Known Allergies  SOCIAL HISTORY:  Social History  Substance Use Topics  . Smoking status: Current Some Day Smoker  . Smokeless tobacco: Never Used  . Alcohol use Yes    FAMILY HISTORY: History reviewed. No pertinent family history.  EXAM: BP 131/68   Pulse 77   Temp 98 F (36.7 C) (Oral)   Resp 18   Ht 5\' 9"  (1.753 m)   Wt 218 lb (98.9 kg)   LMP 08/28/2016 (Approximate)   SpO2 100%   BMI 32.19 kg/m  CONSTITUTIONAL: Alert and oriented and responds appropriately to questions. Well-appearing; well-nourished HEAD: Normocephalic EYES: Conjunctivae clear, PERRL, EOMI ENT: normal nose; no rhinorrhea; moist mucous membranes NECK: Supple, no meningismus, no nuchal rigidity, no LAD  CARD: RRR; S1 and S2 appreciated; no murmurs, no clicks, no rubs, no gallops RESP: Normal chest excursion without splinting or tachypnea; breath sounds clear and equal bilaterally; no wheezes, no rhonchi, no rales, no hypoxia or respiratory distress, speaking full sentences ABD/GI: Normal bowel sounds; non-distended; soft, non-tender, no rebound, no guarding, no peritoneal signs, no hepatosplenomegaly GU:  Normal external genitalia. No lesions, rashes noted. Patient has no vaginal bleeding on  exam. No vaginal discharge.  No adnexal tenderness, mass or fullness, no cervical motion tenderness. Cervix is not appear friable.  Cervix is closed.  Chaperone present for exam. BACK:  The back appears normal and is non-tender to palpation, there is no CVA tenderness EXT: Normal ROM in all joints; non-tender to palpation; no edema; normal capillary refill; no cyanosis, no calf tenderness or  swelling    SKIN: Normal color for age and race; warm; no rash NEURO: Moves all extremities equally, sensation to light touch intact diffusely, cranial nerves II through XII intact, normal speech PSYCH: The patient's mood and manner are appropriate. Grooming and personal hygiene are appropriate.  MEDICAL DECISION MAKING: Patient here with multiple complaints after finding out she was pregnant earlier this week. Exam here is benign. Hemodynamically stable. We'll treat symptomatically with Tylenol, Reglan and IV fluids. Labs, urine pending. We'll perform pelvic exam with cultures. She has not had an ultrasound for this pregnancy yet but low suspicion for torsion, ectopic, TOA given abdominal exam is benign.  ED PROGRESS: Pelvic exam completely benign. No discharge or bleeding. No adnexal tenderness or masses. Again doubt ectopic, torsion. She states that her headache, lower abdominal pain have completely resolved after Tylenol. She is no longer feeling nauseous, dizzy or having any short of breath after 2 L of IV fluids. Urine did show large ketones. There is minimal bacteria many squamous cells but no other sign of infection. Wet prep is pending. I do not feel she needs emergent ultrasound today but will give her outpatient PCP and OB/GYN follow-up and have discussed with her at length return precautions. Recommended Tylenol at home for pain, increase fluid intake and will discharge with prescription for Reglan.   Wet prep shows clue cells. We'll discharge with Flagyl. We'll give outpatient follow-up. Again reiterated return precautions.   At this time, I do not feel there is any life-threatening condition present. I have reviewed and discussed all results (EKG, imaging, lab, urine as appropriate) and exam findings with patient/family. I have reviewed nursing notes and appropriate previous records.  I feel the patient is safe to be discharged home without further emergent workup and can continue workup  as an outpatient as needed. Discussed usual and customary return precautions. Patient/family verbalize understanding and are comfortable with this plan.  Outpatient follow-up has been provided. All questions have been answered.  I personally performed the services described in this documentation, which was scribed in my presence. The recorded information has been reviewed and is accurate.    Layla MawKristen N Ward, DO 10/04/16 [phone]

## 2016-10-17 ENCOUNTER — Encounter (HOSPITAL_COMMUNITY): Payer: Self-pay | Admitting: Emergency Medicine

## 2016-10-17 ENCOUNTER — Emergency Department (HOSPITAL_COMMUNITY)
Admission: EM | Admit: 2016-10-17 | Discharge: 2016-10-17 | Disposition: A | Discharge status: Home / Self Care | Payer: BLUE CROSS/BLUE SHIELD | Attending: Emergency Medicine | Admitting: Emergency Medicine

## 2016-10-17 DIAGNOSIS — O99331 Smoking (tobacco) complicating pregnancy, first trimester: Secondary | ICD-10-CM | POA: Insufficient documentation

## 2016-10-17 DIAGNOSIS — Z3A08 8 weeks gestation of pregnancy: Secondary | ICD-10-CM | POA: Diagnosis not present

## 2016-10-17 DIAGNOSIS — O219 Vomiting of pregnancy, unspecified: Secondary | ICD-10-CM | POA: Diagnosis present

## 2016-10-17 DIAGNOSIS — F17200 Nicotine dependence, unspecified, uncomplicated: Secondary | ICD-10-CM | POA: Insufficient documentation

## 2016-10-17 LAB — COMPREHENSIVE METABOLIC PANEL
ALK PHOS: 57 U/L (ref 38–126)
ALT: 13 U/L — ABNORMAL LOW (ref 14–54)
AST: 16 U/L (ref 15–41)
Albumin: 3.7 g/dL (ref 3.5–5.0)
Anion gap: 9 (ref 5–15)
BILIRUBIN TOTAL: 0.6 mg/dL (ref 0.3–1.2)
BUN: <5 mg/dL — ABNORMAL LOW (ref 6–20)
CALCIUM: 9.1 mg/dL (ref 8.9–10.3)
CO2: 25 mmol/L (ref 22–32)
CREATININE: 0.6 mg/dL (ref 0.44–1.00)
Chloride: 100 mmol/L — ABNORMAL LOW (ref 101–111)
Glucose, Bld: 97 mg/dL (ref 65–99)
Potassium: 2.9 mmol/L — ABNORMAL LOW (ref 3.5–5.1)
Sodium: 134 mmol/L — ABNORMAL LOW (ref 135–145)
TOTAL PROTEIN: 6.7 g/dL (ref 6.5–8.1)

## 2016-10-17 LAB — URINALYSIS, ROUTINE W REFLEX MICROSCOPIC
BILIRUBIN URINE: NEGATIVE
GLUCOSE, UA: NEGATIVE mg/dL
Hgb urine dipstick: NEGATIVE
Ketones, ur: 80 mg/dL — AB
NITRITE: NEGATIVE
PH: 6 (ref 5.0–8.0)
Protein, ur: 30 mg/dL — AB
RBC / HPF: NONE SEEN RBC/hpf (ref 0–5)
SPECIFIC GRAVITY, URINE: 1.021 (ref 1.005–1.030)

## 2016-10-17 LAB — CBC
HCT: 32.7 % — ABNORMAL LOW (ref 36.0–46.0)
Hemoglobin: 11.2 g/dL — ABNORMAL LOW (ref 12.0–15.0)
MCH: 30.4 pg (ref 26.0–34.0)
MCHC: 34.3 g/dL (ref 30.0–36.0)
MCV: 88.9 fL (ref 78.0–100.0)
PLATELETS: 232 10*3/uL (ref 150–400)
RBC: 3.68 MIL/uL — AB (ref 3.87–5.11)
RDW: 12.2 % (ref 11.5–15.5)
WBC: 5 10*3/uL (ref 4.0–10.5)

## 2016-10-17 LAB — LIPASE, BLOOD: LIPASE: 19 U/L (ref 11–51)

## 2016-10-17 MED ORDER — SODIUM CHLORIDE 0.9 % IV BOLUS (SEPSIS)
1000.0000 mL | Freq: Once | INTRAVENOUS | Status: AC
  Administered 2016-10-17: 1000 mL via INTRAVENOUS

## 2016-10-17 MED ORDER — POTASSIUM CHLORIDE CRYS ER 20 MEQ PO TBCR
40.0000 meq | EXTENDED_RELEASE_TABLET | Freq: Once | ORAL | Status: AC
Start: 2016-10-17 — End: 2016-10-17
  Administered 2016-10-17: 40 meq via ORAL
  Filled 2016-10-17: qty 2

## 2016-10-17 NOTE — ED Triage Notes (Signed)
Pt reports she is [redacted] weeks pregnant, vomited 4 times yesterday, denies vomiting today. Reports mucus building up in her throat, denies cough or fever. resp e/u, nad.

## 2016-10-17 NOTE — Discharge Instructions (Signed)
Please read and follow all provided instructions.  Your diagnoses today include:  1. Nausea and vomiting in pregnancy    Tests performed today include:  Blood counts and electrolytes - shows low potassium  Blood tests to check kidney function  Vital signs. See below for your results today.   Medications prescribed:  For nausea you may try half of a 25mg  doxylamine (Unisom) tablet twice a day and 25mg  of Vitamin B6, also called pyroxidine for nausea and vomiting in pregnancy. These medications are both inexpensive when bought over-the-counter.   Take any prescribed medications only as directed.  Home care instructions:   Follow any educational materials contained in this packet.   Drink clear liquids for the next 24 hours and introduce solid foods slowly after 24 hours using the b.r.a.t. diet (Bananas, Rice, Applesauce, Toast, Yogurt).    Follow-up instructions: Please follow-up with your primary care provider in the next 2 days for further evaluation of your symptoms. If you are not feeling better in 48 hours you may have a condition that is more serious and you need re-evaluation.   Return instructions:  SEEK IMMEDIATE MEDICAL ATTENTION IF:  If you have pain that does not go away or becomes severe   A temperature above 101F develops   Repeated vomiting occurs (multiple episodes)   If you have pain that becomes localized to portions of the abdomen. The right side could possibly be appendicitis. In an adult, the left lower portion of the abdomen could be colitis or diverticulitis.   Blood is being passed in stools or vomit (bright red or black tarry stools)   You develop chest pain, difficulty breathing, dizziness or fainting, or become confused, poorly responsive, or inconsolable (young children)  If you have any other emergent concerns regarding your health  Your vital signs today were: BP 114/67    Pulse 96    Temp 97.9 F (36.6 C) (Oral)    Resp 16    LMP 08/28/2016  (Approximate)    SpO2 100%  If your blood pressure (bp) was elevated above 135/85 this visit, please have this repeated by your doctor within one month. --------------

## 2016-10-17 NOTE — ED Provider Notes (Signed)
[facility] DEPT Provider Note   CSN: [id number] Arrival date & time: 10/17/16  8295     History   Chief Complaint Chief Complaint  Patient presents with  . Vomiting    HPI Ann Ruiz is a 21 y.o. female.  Patient who is in her first trimester of pregnancy presents with complaint of vomiting. This is not a new problem however has been relatively persistent over the past several weeks. Patient was seen in emergency department on 10/04/16 and treated with IV fluids and antiemetics. She was discharged home with Reglan. Patient feels that this makes her nausea worse. She reports daily episodes of vomiting and stomach cramping. She has been able to keep down some fluids at times. She has not been able to eat a full meal however. She denies any current abdominal pains. No current vaginal bleeding or discharge. She has been taking iron supplementation and prenatal vitamins. She is scheduled for her first OB/GYN appointment in March. This is her first pregnancy. No diarrhea or blood in the stool. No urinary symptoms, although she thinks that her urine has been darker than usual and a smaller amount.      Past Medical History:  Diagnosis Date  . Seasonal allergies     There are no active problems to display for this patient.   Past Surgical History:  Procedure Laterality Date  . APPENDECTOMY      OB History    Gravida Para Term Preterm AB Living   1             SAB TAB Ectopic Multiple Live Births                   Home Medications    Prior to Admission medications   Medication Sig Start Date End Date Taking? Authorizing Provider  metoCLOPramide (REGLAN) 10 MG tablet Take 1 tablet (10 mg total) by mouth every 6 (six) hours as needed for nausea or vomiting. 10/04/16   Kristen N Ward, DO  metroNIDAZOLE (FLAGYL) 500 MG tablet Take 1 tablet (500 mg total) by mouth 2 (two) times daily. 10/04/16   Layla Maw Ward, DO    Family History No family history on file.  Social  History Social History  Substance Use Topics  . Smoking status: Current Some Day Smoker  . Smokeless tobacco: Never Used  . Alcohol use Yes     Allergies   Patient has no known allergies.   Review of Systems Review of Systems  Constitutional: Negative for fever.  HENT: Negative for rhinorrhea and sore throat.   Eyes: Negative for redness.  Respiratory: Negative for cough.   Cardiovascular: Negative for chest pain.  Gastrointestinal: Positive for nausea and vomiting. Negative for abdominal pain and diarrhea.  Genitourinary: Negative for dysuria, frequency, vaginal bleeding and vaginal discharge.  Musculoskeletal: Negative for myalgias.  Skin: Negative for rash.  Neurological: Negative for headaches.     Physical Exam Updated Vital Signs BP 106/59 (BP Location: Left Arm)   Pulse 79   Temp 97.9 F (36.6 C) (Oral)   Resp 20   LMP 08/28/2016 (Approximate)   SpO2 97%   Physical Exam  Constitutional: She appears well-developed and well-nourished.  HENT:  Head: Normocephalic and atraumatic.  Mouth/Throat: Oropharynx is clear and moist.  Eyes: Conjunctivae are normal. Right eye exhibits no discharge. Left eye exhibits no discharge.  Neck: Normal range of motion. Neck supple.  Cardiovascular: Normal rate, regular rhythm and normal heart sounds.   No murmur heard.  Pulmonary/Chest: Effort normal and breath sounds normal. No respiratory distress. She has no wheezes. She has no rales.  Abdominal: Soft. There is no tenderness. There is no rebound and no guarding.  Neurological: She is alert.  Skin: Skin is warm and dry.  Psychiatric: She has a normal mood and affect.  Nursing note and vitals reviewed.    ED Treatments / Results  Labs (all labs ordered are listed, but only abnormal results are displayed) Labs Reviewed  COMPREHENSIVE METABOLIC PANEL - Abnormal; Notable for the following:       Result Value   Sodium 134 (*)    Potassium 2.9 (*)    Chloride 100 (*)     BUN <5 (*)    ALT 13 (*)    All other components within normal limits  CBC - Abnormal; Notable for the following:    RBC 3.68 (*)    Hemoglobin 11.2 (*)    HCT 32.7 (*)    All other components within normal limits  URINALYSIS, ROUTINE W REFLEX MICROSCOPIC - Abnormal; Notable for the following:    Color, Urine AMBER (*)    APPearance HAZY (*)    Ketones, ur 80 (*)    Protein, ur 30 (*)    Leukocytes, UA TRACE (*)    Bacteria, UA MANY (*)    Squamous Epithelial / LPF 6-30 (*)    All other components within normal limits  LIPASE, BLOOD    EKG  EKG Interpretation None       Radiology No results found.  Procedures Procedures (including critical care time)  Medications Ordered in ED Medications  sodium chloride 0.9 % bolus 1,000 mL (0 mLs Intravenous Stopped 10/17/16 1130)  potassium chloride SA (K-DUR,KLOR-CON) CR tablet 40 mEq (40 mEq Oral Given 10/17/16 1141)     Initial Impression / Assessment and Plan / ED Course  I have reviewed the triage vital signs and the nursing notes.  Pertinent labs & imaging results that were available during my care of the patient were reviewed by me and considered in my medical decision making (see chart for details).     Patient seen and examined. Work-up initiated. Medications ordered. Will treat symptoms and fluid challenge.   Vital signs reviewed and are as follows: BP 106/59 (BP Location: Left Arm)   Pulse 79   Temp 97.9 F (36.6 C) (Oral)   Resp 20   LMP 08/28/2016 (Approximate)   SpO2 97%   Patient is feeling much better after fluids. She is tolerating ginger ale well in the room. She has received oral potassium and will continue her prenatal vitamins.  She states that she feels comfortable with discharge to home. Counseled on use of doxylamine and B6.  Encourage patient to return with abdominal pains, persistent vomiting, new symptoms or other concerns. She verbalizes understanding and agrees with plan.  Final Clinical  Impressions(s) / ED Diagnoses   Final diagnoses:  Nausea and vomiting in pregnancy   Patient with nausea and vomiting in first trimester pregnancy. This is her second visit for the same issue. No significant abdominal pain today, although she does report abdominal cramping with vomiting. No vomiting in emergency department. UA shows 80 ketones. Patient was hydrated with a liter of normal saline. She responded well. Nausea is currently controlled and she is tolerating ginger ale without difficulty. Exam remains unremarkable during ED stay. She feels better and wants to go home. Counseled as above. She has OB/GYN follow-up on 3/20.  New Prescriptions Discharge  Medication List as of 10/17/2016  2:04 PM       Renne CriglerJoshua Alan Drummer, PA-C 10/17/16 1430    Nira ConnPedro Eduardo Cardama, [place] 10/17/16 [phone]

## 2016-11-06 ENCOUNTER — Emergency Department (HOSPITAL_COMMUNITY)
Admission: EM | Admit: 2016-11-06 | Discharge: 2016-11-06 | Disposition: A | Discharge status: Home / Self Care | Payer: BLUE CROSS/BLUE SHIELD | Attending: Emergency Medicine | Admitting: Emergency Medicine

## 2016-11-06 ENCOUNTER — Encounter (HOSPITAL_COMMUNITY): Payer: Self-pay | Admitting: Emergency Medicine

## 2016-11-06 DIAGNOSIS — O99280 Endocrine, nutritional and metabolic diseases complicating pregnancy, unspecified trimester: Secondary | ICD-10-CM | POA: Insufficient documentation

## 2016-11-06 DIAGNOSIS — Z3A12 12 weeks gestation of pregnancy: Secondary | ICD-10-CM | POA: Diagnosis not present

## 2016-11-06 DIAGNOSIS — O219 Vomiting of pregnancy, unspecified: Secondary | ICD-10-CM | POA: Diagnosis not present

## 2016-11-06 DIAGNOSIS — O99331 Smoking (tobacco) complicating pregnancy, first trimester: Secondary | ICD-10-CM | POA: Diagnosis not present

## 2016-11-06 DIAGNOSIS — Z79899 Other long term (current) drug therapy: Secondary | ICD-10-CM | POA: Insufficient documentation

## 2016-11-06 DIAGNOSIS — F17200 Nicotine dependence, unspecified, uncomplicated: Secondary | ICD-10-CM | POA: Diagnosis not present

## 2016-11-06 DIAGNOSIS — E876 Hypokalemia: Secondary | ICD-10-CM

## 2016-11-06 DIAGNOSIS — Z3490 Encounter for supervision of normal pregnancy, unspecified, unspecified trimester: Secondary | ICD-10-CM

## 2016-11-06 LAB — CBC
HEMATOCRIT: 36.1 % (ref 36.0–46.0)
HEMOGLOBIN: 12.4 g/dL (ref 12.0–15.0)
MCH: 30 pg (ref 26.0–34.0)
MCHC: 34.3 g/dL (ref 30.0–36.0)
MCV: 87.4 fL (ref 78.0–100.0)
Platelets: 246 10*3/uL (ref 150–400)
RBC: 4.13 MIL/uL (ref 3.87–5.11)
RDW: 12.5 % (ref 11.5–15.5)
WBC: 7.6 10*3/uL (ref 4.0–10.5)

## 2016-11-06 LAB — URINALYSIS, ROUTINE W REFLEX MICROSCOPIC
Glucose, UA: NEGATIVE mg/dL
HGB URINE DIPSTICK: NEGATIVE
Ketones, ur: 80 mg/dL — AB
Leukocytes, UA: NEGATIVE
NITRITE: NEGATIVE
PROTEIN: 100 mg/dL — AB
Specific Gravity, Urine: 1.031 — ABNORMAL HIGH (ref 1.005–1.030)
pH: 6 (ref 5.0–8.0)

## 2016-11-06 LAB — COMPREHENSIVE METABOLIC PANEL
ALBUMIN: 3.8 g/dL (ref 3.5–5.0)
ALT: 21 U/L (ref 14–54)
ANION GAP: 15 (ref 5–15)
AST: 17 U/L (ref 15–41)
Alkaline Phosphatase: 69 U/L (ref 38–126)
CO2: 20 mmol/L — ABNORMAL LOW (ref 22–32)
Calcium: 9.2 mg/dL (ref 8.9–10.3)
Chloride: 99 mmol/L — ABNORMAL LOW (ref 101–111)
Creatinine, Ser: 0.5 mg/dL (ref 0.44–1.00)
GFR calc Af Amer: >60 mL/min (ref >60)
GFR calc non Af Amer: >60 mL/min (ref >60)
GLUCOSE: 100 mg/dL — AB (ref 65–99)
POTASSIUM: 2.9 mmol/L — AB (ref 3.5–5.1)
Sodium: 134 mmol/L — ABNORMAL LOW (ref 135–145)
TOTAL PROTEIN: 7 g/dL (ref 6.5–8.1)
Total Bilirubin: 0.6 mg/dL (ref 0.3–1.2)

## 2016-11-06 LAB — LIPASE, BLOOD: LIPASE: 10 U/L — AB (ref 11–51)

## 2016-11-06 LAB — PREGNANCY, URINE: PREG TEST UR: POSITIVE — AB

## 2016-11-06 MED ORDER — SODIUM CHLORIDE 0.9 % IV BOLUS (SEPSIS)
1000.0000 mL | Freq: Once | INTRAVENOUS | Status: AC
  Administered 2016-11-06: 1000 mL via INTRAVENOUS

## 2016-11-06 MED ORDER — ONDANSETRON HCL 4 MG/2ML IJ SOLN
4.0000 mg | Freq: Once | INTRAMUSCULAR | Status: AC
  Administered 2016-11-06: 4 mg via INTRAVENOUS
  Filled 2016-11-06: qty 2

## 2016-11-06 MED ORDER — POTASSIUM CHLORIDE CRYS ER 20 MEQ PO TBCR
40.0000 meq | EXTENDED_RELEASE_TABLET | Freq: Once | ORAL | Status: AC
  Administered 2016-11-06: 40 meq via ORAL
  Filled 2016-11-06: qty 2

## 2016-11-06 NOTE — ED Notes (Signed)
lmp jan

## 2016-11-06 NOTE — ED Notes (Signed)
The pt is 3 months preg and since yesterday she has had generalized body aches  She took tylenol which helped  Her heart was beating fast also in the past few days  She has not had a bm for 3 weeks  Ist baby

## 2016-11-06 NOTE — ED Provider Notes (Signed)
[facility] Provider Note   CSN: [id number] Arrival date & time: 11/06/16  1704     History   Chief Complaint Chief Complaint  Patient presents with  . Emesis    HPI Ann Ruiz is a 21 y.o. female.  Patient presents with recurrent vomiting for the past for 5 days. No urinary or vaginal symptoms. No vaginal bleeding. Patient feels dehydrated. Patient 3 months pregnant has appointment this Wednesday with OB. Patient has no infectious symptoms. No abdominal pain or vaginal bleeding.      Past Medical History:  Diagnosis Date  . Seasonal allergies     There are no active problems to display for this patient.   Past Surgical History:  Procedure Laterality Date  . APPENDECTOMY      OB History    Gravida Para Term Preterm AB Living   1             SAB TAB Ectopic Multiple Live Births                   Home Medications    Prior to Admission medications   Medication Sig Start Date End Date Taking? Authorizing Provider  acetaminophen (TYLENOL) 325 MG tablet Take 650 mg by mouth every 6 (six) hours as needed for mild pain.    Historical Provider, MD  ferrous sulfate 325 (65 FE) MG EC tablet Take 325 mg by mouth daily.    Historical Provider, MD  metoCLOPramide (REGLAN) 10 MG tablet Take 1 tablet (10 mg total) by mouth every 6 (six) hours as needed for nausea or vomiting. 10/04/16   Kristen N Ward, DO  metroNIDAZOLE (FLAGYL) 500 MG tablet Take 1 tablet (500 mg total) by mouth 2 (two) times daily. 10/04/16   Kristen N Ward, DO  Prenatal Vit-Fe Fumarate-FA (MULTIVITAMIN-PRENATAL) 27-0.8 MG TABS tablet Take 1 tablet by mouth daily at 12 noon.    Historical Provider, MD    Family History No family history on file.  Social History Social History  Substance Use Topics  . Smoking status: Current Some Day Smoker  . Smokeless tobacco: Never Used  . Alcohol use Yes     Allergies   Patient has no known allergies.   Review of Systems Review of Systems    Constitutional: Positive for appetite change. Negative for chills and fever.  HENT: Negative for congestion.   Eyes: Negative for visual disturbance.  Respiratory: Negative for shortness of breath.   Cardiovascular: Negative for chest pain.  Gastrointestinal: Positive for nausea and vomiting. Negative for abdominal pain.  Genitourinary: Negative for dysuria and flank pain.  Musculoskeletal: Negative for back pain, neck pain and neck stiffness.  Skin: Negative for rash.  Neurological: Negative for light-headedness and headaches.     Physical Exam Updated Vital Signs BP 97/61   Pulse 79   Temp 98 F (36.7 C) (Oral)   Resp 20   Ht 5\' 9"  (1.753 m)   Wt 218 lb (98.9 kg)   LMP 08/28/2016 (Approximate)   SpO2 99%   BMI 32.19 kg/m   Physical Exam  Constitutional: She is oriented to person, place, and time. She appears well-developed and well-nourished.  HENT:  Head: Normocephalic and atraumatic.  Mild dry mucous membranes  Eyes: Conjunctivae are normal. Right eye exhibits no discharge. Left eye exhibits no discharge.  Neck: Normal range of motion. Neck supple. No tracheal deviation present.  Cardiovascular: Normal rate and regular rhythm.   Pulmonary/Chest: Effort normal and breath sounds normal.  Abdominal: Soft. She exhibits no distension. There is no tenderness. There is no guarding.  Musculoskeletal: She exhibits no edema.  Neurological: She is alert and oriented to person, place, and time.  Skin: Skin is warm. No rash noted.  Psychiatric: She has a normal mood and affect.  Nursing note and vitals reviewed.    ED Treatments / Results  Labs (all labs ordered are listed, but only abnormal results are displayed) Labs Reviewed  LIPASE, BLOOD - Abnormal; Notable for the following:       Result Value   Lipase 10 (*)    All other components within normal limits  COMPREHENSIVE METABOLIC PANEL - Abnormal; Notable for the following:    Sodium 134 (*)    Potassium 2.9 (*)     Chloride 99 (*)    CO2 20 (*)    Glucose, Bld 100 (*)    BUN <5 (*)    All other components within normal limits  URINALYSIS, ROUTINE W REFLEX MICROSCOPIC - Abnormal; Notable for the following:    Color, Urine AMBER (*)    APPearance HAZY (*)    Specific Gravity, Urine 1.031 (*)    Bilirubin Urine SMALL (*)    Ketones, ur 80 (*)    Protein, ur 100 (*)    Bacteria, UA RARE (*)    Squamous Epithelial / LPF 0-5 (*)    All other components within normal limits  PREGNANCY, URINE - Abnormal; Notable for the following:    Preg Test, Ur POSITIVE (*)    All other components within normal limits  URINE CULTURE  CBC    EKG  EKG Interpretation None       Radiology No results found.  Procedures Procedures (including critical care time)  Medications Ordered in ED Medications  potassium chloride SA (K-DUR,KLOR-CON) CR tablet 40 mEq (not administered)  sodium chloride 0.9 % bolus 1,000 mL (1,000 mLs Intravenous New Bag/Given 11/06/16 1849)  ondansetron (ZOFRAN) injection 4 mg (4 mg Intravenous Given 11/06/16 1851)     Initial Impression / Assessment and Plan / ED Course  I have reviewed the triage vital signs and the nursing notes.  Pertinent labs & imaging results that were available during my care of the patient were reviewed by me and considered in my medical decision making (see chart for details).     Patient presents 3 months pregnant with recurrent vomiting for 2 days. Patient received IV fluids in the ER and improved significantly. Oral fluids ordered. Discussed follow-up with OB/GYN. Patient has no abdominal pain or vaginal symptoms.  Results and differential diagnosis were discussed with the patient/parent/guardian. Xrays were independently reviewed by myself.  Close follow up outpatient was discussed, comfortable with the plan.   Medications  potassium chloride SA (K-DUR,KLOR-CON) CR tablet 40 mEq (not administered)  sodium chloride 0.9 % bolus 1,000 mL (1,000  mLs Intravenous New Bag/Given 11/06/16 1849)  ondansetron (ZOFRAN) injection 4 mg (4 mg Intravenous Given 11/06/16 1851)    Vitals:   11/06/16 1815 11/06/16 1830 11/06/16 1900 11/06/16 1930  BP: 101/64 99/65 (!) 98/52 97/61  Pulse: 96 80 87 79  Resp:      Temp:      TempSrc:      SpO2: 98% 97% 100% 99%  Weight:      Height:        Final diagnoses:  Pregnancy, unspecified gestational age  Hypokalemia  Vomiting pregnancy    Final Clinical Impressions(s) / ED Diagnoses   Final diagnoses:  Pregnancy, unspecified gestational age  Hypokalemia  Vomiting pregnancy    New Prescriptions New Prescriptions   No medications on file     Blane Ohara, MD 11/06/16 2023

## 2016-11-06 NOTE — ED Triage Notes (Addendum)
Pt c/o vomiting x 6 within 24 hours. Pt feels she is dehydrated. Pt reports she is 3 months pregnant. Pt 1st meeting with OB is Wednesday.

## 2016-11-06 NOTE — Discharge Instructions (Signed)
If you were given medicines take as directed.  If you are on coumadin or contraceptives realize their levels and effectiveness is altered by many different medicines.  If you have any reaction (rash, tongues swelling, other) to the medicines stop taking and see a physician.   Take prenatal vitamin.  See OB this week. No alcohol use.   If your blood pressure was elevated in the ER make sure you follow up for management with a primary doctor or return for chest pain, shortness of breath or stroke symptoms.  Please follow up as directed and return to the ER or see a physician for new or worsening symptoms.  Thank you. Vitals:   11/06/16 1800 11/06/16 1815 11/06/16 1830 11/06/16 1900  BP: 102/62 101/64 99/65 (!) 98/52  Pulse: 92 96 80 87  Resp:      Temp:      TempSrc:      SpO2: 98% 98% 97% 100%  Weight:      Height:

## 2016-11-08 ENCOUNTER — Ambulatory Visit (INDEPENDENT_AMBULATORY_CARE_PROVIDER_SITE_OTHER): Payer: BLUE CROSS/BLUE SHIELD | Admitting: Obstetrics & Gynecology

## 2016-11-08 ENCOUNTER — Encounter: Payer: Self-pay | Admitting: Obstetrics & Gynecology

## 2016-11-08 ENCOUNTER — Other Ambulatory Visit (HOSPITAL_COMMUNITY)
Admission: RE | Admit: 2016-11-08 | Discharge: 2016-11-08 | Disposition: A | Discharge status: Home / Self Care | Payer: BLUE CROSS/BLUE SHIELD | Source: Ambulatory Visit | Attending: Obstetrics & Gynecology | Admitting: Obstetrics & Gynecology

## 2016-11-08 DIAGNOSIS — Z3A10 10 weeks gestation of pregnancy: Secondary | ICD-10-CM | POA: Insufficient documentation

## 2016-11-08 DIAGNOSIS — Z3491 Encounter for supervision of normal pregnancy, unspecified, first trimester: Secondary | ICD-10-CM | POA: Insufficient documentation

## 2016-11-08 DIAGNOSIS — Z3401 Encounter for supervision of normal first pregnancy, first trimester: Secondary | ICD-10-CM | POA: Diagnosis not present

## 2016-11-08 DIAGNOSIS — Z3490 Encounter for supervision of normal pregnancy, unspecified, unspecified trimester: Secondary | ICD-10-CM | POA: Insufficient documentation

## 2016-11-08 DIAGNOSIS — O219 Vomiting of pregnancy, unspecified: Secondary | ICD-10-CM

## 2016-11-08 LAB — POCT URINALYSIS DIPSTICK
Bilirubin, UA: NEGATIVE
Blood, UA: NEGATIVE
Glucose, UA: NEGATIVE
Leukocytes, UA: NEGATIVE
Nitrite, UA: NEGATIVE
SPEC GRAV UA: 1.015 (ref 1.030–1.035)
UROBILINOGEN UA: NEGATIVE (ref ?–2.0)
pH, UA: 6.5 (ref 5.0–8.0)

## 2016-11-08 LAB — URINE CULTURE

## 2016-11-08 MED ORDER — ONDANSETRON 4 MG PO TBDP: 4.0000 mg | ORAL_TABLET | Freq: Four times a day (QID) | ORAL | 0 refills | Status: DC | PRN

## 2016-11-08 MED ORDER — PRENATE PIXIE 10-0.6-0.4-200 MG PO CAPS: 1.0000 | ORAL_CAPSULE | Freq: Every day | ORAL | 11 refills | Status: AC

## 2016-11-08 NOTE — Progress Notes (Signed)
Pt states that she is having severe N&V, has been seen at hospital for problem. Pt states that she is having increase in sinus drainage, unsure of what to take. Pt would like to have Rx for prenatal vitamin.  Pt states that her LMP was the beginning of January but does not remember date.

## 2016-11-08 NOTE — Addendum Note (Signed)
Addended by: Marya LandryFOSTER, Criag Wicklund D on: 11/08/2016 05:13 PM   Modules accepted: Orders

## 2016-11-08 NOTE — Patient Instructions (Signed)
First Trimester of Pregnancy The first trimester of pregnancy is from week 1 until the end of week 13 (months 1 through 3). A week after a sperm fertilizes an egg, the egg will implant on the wall of the uterus. This embryo will begin to develop into a baby. Genes from you and your partner will form the baby. The female genes will determine whether the baby will be a boy or a girl. At 6-8 weeks, the eyes and face will be formed, and the heartbeat can be seen on ultrasound. At the end of 12 weeks, all the baby's organs will be formed. Now that you are pregnant, you will want to do everything you can to have a healthy baby. Two of the most important things are to get good prenatal care and to follow your health care provider's instructions. Prenatal care is all the medical care you receive before the baby's birth. This care will help prevent, find, and treat any problems during the pregnancy and childbirth. Body changes during your first trimester Your body goes through many changes during pregnancy. The changes vary from woman to woman.  You may gain or lose a couple of pounds at first.  You may feel sick to your stomach (nauseous) and you may throw up (vomit). If the vomiting is uncontrollable, call your health care provider.  You may tire easily.  You may develop headaches that can be relieved by medicines. All medicines should be approved by your health care provider.  You may urinate more often. Painful urination may mean you have a bladder infection.  You may develop heartburn as a result of your pregnancy.  You may develop constipation because certain hormones are causing the muscles that push stool through your intestines to slow down.  You may develop hemorrhoids or swollen veins (varicose veins).  Your breasts may begin to grow larger and become tender. Your nipples may stick out more, and the tissue that surrounds them (areola) may become darker.  Your gums may bleed and may be  sensitive to brushing and flossing.  Dark spots or blotches (chloasma, mask of pregnancy) may develop on your face. This will likely fade after the baby is born.  Your menstrual periods will stop.  You may have a loss of appetite.  You may develop cravings for certain kinds of food.  You may have changes in your emotions from day to day, such as being excited to be pregnant or being concerned that something may go wrong with the pregnancy and baby.  You may have more vivid and strange dreams.  You may have changes in your hair. These can include thickening of your hair, rapid growth, and changes in texture. Some women also have hair loss during or after pregnancy, or hair that feels dry or thin. Your hair will most likely return to normal after your baby is born.  What to expect at prenatal visits During a routine prenatal visit:  You will be weighed to make sure you and the baby are growing normally.  Your blood pressure will be taken.  Your abdomen will be measured to track your baby's growth.  The fetal heartbeat will be listened to between weeks 10 and 14 of your pregnancy.  Test results from any previous visits will be discussed.  Your health care provider may ask you:  How you are feeling.  If you are feeling the baby move.  If you have had any abnormal symptoms, such as leaking fluid, bleeding, severe headaches,   or abdominal cramping.  If you are using any tobacco products, including cigarettes, chewing tobacco, and electronic cigarettes.  If you have any questions.  Other tests that may be performed during your first trimester include:  Blood tests to find your blood type and to check for the presence of any previous infections. The tests will also be used to check for low iron levels (anemia) and protein on red blood cells (Rh antibodies). Depending on your risk factors, or if you previously had diabetes during pregnancy, you may have tests to check for high blood  sugar that affects pregnant women (gestational diabetes).  Urine tests to check for infections, diabetes, or protein in the urine.  An ultrasound to confirm the proper growth and development of the baby.  Fetal screens for spinal cord problems (spina bifida) and Down syndrome.  HIV (human immunodeficiency virus) testing. Routine prenatal testing includes screening for HIV, unless you choose not to have this test.  You may need other tests to make sure you and the baby are doing well.  Follow these instructions at home: Medicines  Follow your health care provider's instructions regarding medicine use. Specific medicines may be either safe or unsafe to take during pregnancy.  Take a prenatal vitamin that contains at least 600 micrograms (mcg) of folic acid.  If you develop constipation, try taking a stool softener if your health care provider approves. Eating and drinking  Eat a balanced diet that includes fresh fruits and vegetables, whole grains, good sources of protein such as meat, eggs, or tofu, and low-fat dairy. Your health care provider will help you determine the amount of weight gain that is right for you.  Avoid raw meat and uncooked cheese. These carry germs that can cause birth defects in the baby.  Eating four or five small meals rather than three large meals a day may help relieve nausea and vomiting. If you start to feel nauseous, eating a few soda crackers can be helpful. Drinking liquids between meals, instead of during meals, also seems to help ease nausea and vomiting.  Limit foods that are high in fat and processed sugars, such as fried and sweet foods.  To prevent constipation: ? Eat foods that are high in fiber, such as fresh fruits and vegetables, whole grains, and beans. ? Drink enough fluid to keep your urine clear or pale yellow. Activity  Exercise only as directed by your health care provider. Most women can continue their usual exercise routine during  pregnancy. Try to exercise for 30 minutes at least 5 days a week. Exercising will help you: ? Control your weight. ? Stay in shape. ? Be prepared for labor and delivery.  Experiencing pain or cramping in the lower abdomen or lower back is a good sign that you should stop exercising. Check with your health care provider before continuing with normal exercises.  Try to avoid standing for long periods of time. Move your legs often if you must stand in one place for a long time.  Avoid heavy lifting.  Wear low-heeled shoes and practice good posture.  You may continue to have sex unless your health care provider tells you not to. Relieving pain and discomfort  Wear a good support bra to relieve breast tenderness.  Take warm sitz baths to soothe any pain or discomfort caused by hemorrhoids. Use hemorrhoid cream if your health care provider approves.  Rest with your legs elevated if you have leg cramps or low back pain.  If you develop   varicose veins in your legs, wear support hose. Elevate your feet for 15 minutes, 3-4 times a day. Limit salt in your diet. Prenatal care  Schedule your prenatal visits by the twelfth week of pregnancy. They are usually scheduled monthly at first, then more often in the last 2 months before delivery.  Write down your questions. Take them to your prenatal visits.  Keep all your prenatal visits as told by your health care provider. This is important. Safety  Wear your seat belt at all times when driving.  Make a list of emergency phone numbers, including numbers for family, friends, the hospital, and police and fire departments. General instructions  Ask your health care provider for a referral to a local prenatal education class. Begin classes no later than the beginning of month 6 of your pregnancy.  Ask for help if you have counseling or nutritional needs during pregnancy. Your health care provider can offer advice or refer you to specialists for help  with various needs.  Do not use hot tubs, steam rooms, or saunas.  Do not douche or use tampons or scented sanitary pads.  Do not cross your legs for long periods of time.  Avoid cat litter boxes and soil used by cats. These carry germs that can cause birth defects in the baby and possibly loss of the fetus by miscarriage or stillbirth.  Avoid all smoking, herbs, alcohol, and medicines not prescribed by your health care provider. Chemicals in these products affect the formation and growth of the baby.  Do not use any products that contain nicotine or tobacco, such as cigarettes and e-cigarettes. If you need help quitting, ask your health care provider. You may receive counseling support and other resources to help you quit.  Schedule a dentist appointment. At home, brush your teeth with a soft toothbrush and be gentle when you floss. Contact a health care provider if:  You have dizziness.  You have mild pelvic cramps, pelvic pressure, or nagging pain in the abdominal area.  You have persistent nausea, vomiting, or diarrhea.  You have a bad smelling vaginal discharge.  You have pain when you urinate.  You notice increased swelling in your face, hands, legs, or ankles.  You are exposed to fifth disease or chickenpox.  You are exposed to German measles (rubella) and have never had it. Get help right away if:  You have a fever.  You are leaking fluid from your vagina.  You have spotting or bleeding from your vagina.  You have severe abdominal cramping or pain.  You have rapid weight gain or loss.  You vomit blood or material that looks like coffee grounds.  You develop a severe headache.  You have shortness of breath.  You have any kind of trauma, such as from a fall or a car accident. Summary  The first trimester of pregnancy is from week 1 until the end of week 13 (months 1 through 3).  Your body goes through many changes during pregnancy. The changes vary from  woman to woman.  You will have routine prenatal visits. During those visits, your health care provider will examine you, discuss any test results you may have, and talk with you about how you are feeling. This information is not intended to replace advice given to you by your health care provider. Make sure you discuss any questions you have with your health care provider. Document Released: 08/02/2001 Document Revised: 07/20/2016 Document Reviewed: 07/20/2016 Elsevier Interactive Patient Education  2017 Elsevier   Inc[employer]  

## 2016-11-08 NOTE — Progress Notes (Signed)
  Subjective:    Ann CellarStenesa Ruiz is a G1P0 5636w2d being seen today for her first obstetrical visit.  Her obstetrical history is significant for first pregnancy. Patient does intend to breast feed. Pregnancy history fully reviewed.  Patient reports nausea and vomiting.  Vitals:   11/08/16 1359  BP: 108/72  Pulse: 90  Weight: 209 lb (94.8 kg)    HISTORY: OB History  Gravida Para Term Preterm AB Living  1            SAB TAB Ectopic Multiple Live Births               # Outcome Date GA Lbr Len/2nd Weight Sex Delivery Anes PTL Lv  1 Current              Past Medical History:  Diagnosis Date  . Seasonal allergies    Past Surgical History:  Procedure Laterality Date  . APPENDECTOMY     History reviewed. No pertinent family history.   Exam    Uterus:     Pelvic Exam:    Perineum: No Hemorrhoids   Vulva: normal   Vagina:  normal mucosa   pH:     Cervix: no lesions   Adnexa: normal adnexa   Bony Pelvis: average  System: Breast:  normal appearance, no masses or tenderness   Skin: normal coloration and turgor, no rashes    Neurologic: oriented, normal mood   Extremities: normal strength, tone, and muscle mass   HEENT thyroid without masses   Mouth/Teeth mucous membranes moist, pharynx normal without lesions and dental hygiene good   Neck supple and no masses   Cardiovascular: regular rate and rhythm, no murmurs or gallops   Respiratory:  appears well, vitals normal, no respiratory distress, acyanotic, normal RR, neck free of mass or lymphadenopathy, chest clear, no wheezing, crepitations, rhonchi, normal symmetric air entry   Abdomen: soft, non-tender; bowel sounds normal; no masses,  no organomegaly   Urinary: urethral meatus normal      Assessment:    Pregnancy: G1P0 Patient Active Problem List   Diagnosis Date Noted  . Supervision of normal pregnancy, antepartum 11/08/2016        Plan:     Initial labs drawn. Prenatal vitamins. Problem list reviewed and  updated. Genetic Screening discussed First Screen: ordered.  Ultrasound discussed; fetal survey: 18+ weeks.  Follow up in 4 weeks. 50% of 30 min visit spent on counseling and coordination of care.  Zofran for nausea   Scheryl DarterJames Awilda Covin 11/08/2016

## 2016-11-09 LAB — OBSTETRIC PANEL, INCLUDING HIV
ANTIBODY SCREEN: NEGATIVE
BASOS: 0 %
Basophils Absolute: 0 10*3/uL (ref 0.0–0.2)
EOS (ABSOLUTE): 0 10*3/uL (ref 0.0–0.4)
EOS: 0 %
HIV Screen 4th Generation wRfx: NONREACTIVE
Hematocrit: 34.7 % (ref 34.0–46.6)
Hemoglobin: 11.7 g/dL (ref 11.1–15.9)
Hepatitis B Surface Ag: NEGATIVE
Immature Grans (Abs): 0 10*3/uL (ref 0.0–0.1)
Immature Granulocytes: 0 %
LYMPHS ABS: 0.9 10*3/uL (ref 0.7–3.1)
LYMPHS: 13 %
MCH: 30.2 pg (ref 26.6–33.0)
MCHC: 33.7 g/dL (ref 31.5–35.7)
MCV: 89 fL (ref 79–97)
MONOS ABS: 0.5 10*3/uL (ref 0.1–0.9)
Monocytes: 7 %
Neutrophils Absolute: 5.6 10*3/uL (ref 1.4–7.0)
Neutrophils: 80 %
Platelets: 245 10*3/uL (ref 150–379)
RBC: 3.88 x10E6/uL (ref 3.77–5.28)
RDW: 13.6 % (ref 12.3–15.4)
RH TYPE: POSITIVE
RPR Ser Ql: NONREACTIVE
Rubella Antibodies, IGG: 2.09 index (ref >0.99)
WBC: 7.1 10*3/uL (ref 3.4–10.8)

## 2016-11-09 LAB — VARICELLA ZOSTER ANTIBODY, IGG: Varicella zoster IgG: 3223 index (ref >165)

## 2016-11-10 LAB — CERVICOVAGINAL ANCILLARY ONLY
Bacterial vaginitis: NEGATIVE
CANDIDA VAGINITIS: NEGATIVE
Chlamydia: NEGATIVE
Neisseria Gonorrhea: NEGATIVE
TRICH (WINDOWPATH): NEGATIVE

## 2016-11-10 LAB — CYTOLOGY - PAP: DIAGNOSIS: NEGATIVE

## 2016-11-13 LAB — CULTURE, OB URINE

## 2016-11-13 LAB — URINE CULTURE, OB REFLEX

## 2016-11-24 ENCOUNTER — Ambulatory Visit (HOSPITAL_COMMUNITY)
Admission: RE | Admit: 2016-11-24 | Discharge: 2016-11-24 | Disposition: A | Discharge status: Home / Self Care | Payer: BLUE CROSS/BLUE SHIELD | Source: Ambulatory Visit | Attending: Obstetrics & Gynecology | Admitting: Obstetrics & Gynecology

## 2016-11-24 ENCOUNTER — Telehealth: Payer: Self-pay | Admitting: *Deleted

## 2016-11-24 ENCOUNTER — Encounter (HOSPITAL_COMMUNITY): Payer: Self-pay

## 2016-11-24 DIAGNOSIS — Z3682 Encounter for antenatal screening for nuchal translucency: Secondary | ICD-10-CM | POA: Insufficient documentation

## 2016-11-24 DIAGNOSIS — Z3A12 12 weeks gestation of pregnancy: Secondary | ICD-10-CM | POA: Insufficient documentation

## 2016-11-24 DIAGNOSIS — Z3490 Encounter for supervision of normal pregnancy, unspecified, unspecified trimester: Secondary | ICD-10-CM | POA: Diagnosis present

## 2016-11-24 DIAGNOSIS — Z3400 Encounter for supervision of normal first pregnancy, unspecified trimester: Secondary | ICD-10-CM

## 2016-11-24 HISTORY — DX: Other specified health status: Z78.9

## 2016-11-24 NOTE — Telephone Encounter (Signed)
Babyscripts called to office regarding pt BP. Initial reading of 112/99 with repeat of 108/80.  States patient answer yes to N&V and some HA per routine questionnaire.  Reviewed with Dr Debroah Loop. He is fine with repeat reading, nothing additional needed at this time.

## 2016-11-29 ENCOUNTER — Other Ambulatory Visit: Payer: Self-pay

## 2016-11-29 DIAGNOSIS — Z3400 Encounter for supervision of normal first pregnancy, unspecified trimester: Secondary | ICD-10-CM

## 2016-11-30 ENCOUNTER — Encounter: Payer: Self-pay | Admitting: *Deleted

## 2016-12-06 ENCOUNTER — Ambulatory Visit (INDEPENDENT_AMBULATORY_CARE_PROVIDER_SITE_OTHER): Payer: BLUE CROSS/BLUE SHIELD | Admitting: Obstetrics and Gynecology

## 2016-12-06 VITALS — BP 110/69 | HR 80 | Wt 205.2 lb

## 2016-12-06 DIAGNOSIS — Z3401 Encounter for supervision of normal first pregnancy, first trimester: Secondary | ICD-10-CM

## 2016-12-06 DIAGNOSIS — Z3400 Encounter for supervision of normal first pregnancy, unspecified trimester: Secondary | ICD-10-CM

## 2016-12-06 NOTE — Progress Notes (Signed)
Patient reports she is doing well 

## 2016-12-06 NOTE — Patient Instructions (Signed)

## 2016-12-06 NOTE — Progress Notes (Signed)
Subjective:  Ann Ruiz is a 21 y.o. G1P0 at [redacted]w[redacted]d being seen today for ongoing prenatal care.  She is currently monitored for the following issues for this low-risk pregnancy and has Supervision of normal pregnancy, antepartum on her problem list.  Patient reports no complaints.  Contractions: Not present. Vag. Bleeding: None.   . Denies leaking of fluid.   The following portions of the patient's history were reviewed and updated as appropriate: allergies, current medications, past family history, past medical history, past social history, past surgical history and problem list. Problem list updated.  Objective:   Vitals:   12/06/16 1333  BP: 110/69  Pulse: 80  Weight: 205 lb 3.2 oz (93.1 kg)    Fetal Status: Fetal Heart Rate (bpm): 160         General:  Alert, oriented and cooperative. Patient is in no acute distress.  Skin: Skin is warm and dry. No rash noted.   Cardiovascular: Normal heart rate noted  Respiratory: Normal respiratory effort, no problems with respiration noted  Abdomen: Soft, gravid, appropriate for gestational age. Pain/Pressure: Absent     Pelvic:  Cervical exam deferred        Extremities: Normal range of motion.  Edema: None  Mental Status: Normal mood and affect. Normal behavior. Normal judgment and thought content.   Urinalysis:      Assessment and Plan:  Pregnancy: G1P0 at [redacted]w[redacted]d  1. Supervision of normal first pregnancy, antepartum Doing well AFP with next visit if desires NT was normal - [place] MFM OB COMP + 14 WK; Future  Preterm labor symptoms and general obstetric precautions including but not limited to vaginal bleeding, contractions, leaking of fluid and fetal movement were reviewed in detail with the patient. Please refer to After Visit Summary for other counseling recommendations.  Return in about 4 weeks (around 01/03/2017) for OB visit.   Hermina Staggers, MD

## 2017-01-03 ENCOUNTER — Ambulatory Visit (INDEPENDENT_AMBULATORY_CARE_PROVIDER_SITE_OTHER): Payer: BLUE CROSS/BLUE SHIELD | Admitting: Obstetrics & Gynecology

## 2017-01-03 VITALS — BP 109/68 | HR 73 | Wt 215.0 lb

## 2017-01-03 DIAGNOSIS — Z3400 Encounter for supervision of normal first pregnancy, unspecified trimester: Secondary | ICD-10-CM

## 2017-01-03 DIAGNOSIS — Z3482 Encounter for supervision of other normal pregnancy, second trimester: Secondary | ICD-10-CM

## 2017-01-03 NOTE — Progress Notes (Signed)
Patient does not have concerns today

## 2017-01-03 NOTE — Patient Instructions (Signed)
Second Trimester of Pregnancy The second trimester is from week 13 through week 28, month 4 through 6. This is often the time in pregnancy that you feel your best. Often times, morning sickness has lessened or quit. You may have more energy, and you may get hungry more often. Your unborn baby (fetus) is growing rapidly. At the end of the sixth month, he or she is about 9 inches long and weighs about 1 pounds. You will likely feel the baby move (quickening) between 18 and 20 weeks of pregnancy. Follow these instructions at home:  Avoid all smoking, herbs, and alcohol. Avoid drugs not approved by your doctor.  Do not use any tobacco products, including cigarettes, chewing tobacco, and electronic cigarettes. If you need help quitting, ask your doctor. You may get counseling or other support to help you quit.  Only take medicine as told by your doctor. Some medicines are safe and some are not during pregnancy.  Exercise only as told by your doctor. Stop exercising if you start having cramps.  Eat regular, healthy meals.  Wear a good support bra if your breasts are tender.  Do not use hot tubs, steam rooms, or saunas.  Wear your seat belt when driving.  Avoid raw meat, uncooked cheese, and liter boxes and soil used by cats.  Take your prenatal vitamins.  Take 1500-2000 milligrams of calcium daily starting at the 20th week of pregnancy until you deliver your baby.  Try taking medicine that helps you poop (stool softener) as needed, and if your doctor approves. Eat more fiber by eating fresh fruit, vegetables, and whole grains. Drink enough fluids to keep your pee (urine) clear or pale yellow.  Take warm water baths (sitz baths) to soothe pain or discomfort caused by hemorrhoids. Use hemorrhoid cream if your doctor approves.  If you have puffy, bulging veins (varicose veins), wear support hose. Raise (elevate) your feet for 15 minutes, 3-4 times a day. Limit salt in your diet.  Avoid heavy  lifting, wear low heals, and sit up straight.  Rest with your legs raised if you have leg cramps or low back pain.  Visit your dentist if you have not gone during your pregnancy. Use a soft toothbrush to brush your teeth. Be gentle when you floss.  You can have sex (intercourse) unless your doctor tells you not to.  Go to your doctor visits. Get help if:  You feel dizzy.  You have mild cramps or pressure in your lower belly (abdomen).  You have a nagging pain in your belly area.  You continue to feel sick to your stomach (nauseous), throw up (vomit), or have watery poop (diarrhea).  You have bad smelling fluid coming from your vagina.  You have pain with peeing (urination). Get help right away if:  You have a fever.  You are leaking fluid from your vagina.  You have spotting or bleeding from your vagina.  You have severe belly cramping or pain.  You lose or gain weight rapidly.  You have trouble catching your breath and have chest pain.  You notice sudden or extreme puffiness (swelling) of your face, hands, ankles, feet, or legs.  You have not felt the baby move in over an hour.  You have severe headaches that do not go away with medicine.  You have vision changes. This information is not intended to replace advice given to you by your health care provider. Make sure you discuss any questions you have with your health care   provider. Document Released: 11/02/2009 Document Revised: 01/14/2016 Document Reviewed: 10/09/2012 Elsevier Interactive Patient Education  2017 [employer] Inc.  

## 2017-01-05 ENCOUNTER — Other Ambulatory Visit: Payer: Self-pay | Admitting: Obstetrics and Gynecology

## 2017-01-05 ENCOUNTER — Ambulatory Visit (HOSPITAL_COMMUNITY)
Admission: RE | Admit: 2017-01-05 | Discharge: 2017-01-05 | Disposition: A | Discharge status: Home / Self Care | Payer: BLUE CROSS/BLUE SHIELD | Source: Ambulatory Visit | Attending: Obstetrics and Gynecology | Admitting: Obstetrics and Gynecology

## 2017-01-05 DIAGNOSIS — Z3A18 18 weeks gestation of pregnancy: Secondary | ICD-10-CM | POA: Diagnosis not present

## 2017-01-05 DIAGNOSIS — Z3400 Encounter for supervision of normal first pregnancy, unspecified trimester: Secondary | ICD-10-CM

## 2017-01-05 DIAGNOSIS — Z3492 Encounter for supervision of normal pregnancy, unspecified, second trimester: Secondary | ICD-10-CM | POA: Insufficient documentation

## 2017-01-05 DIAGNOSIS — Z363 Encounter for antenatal screening for malformations: Secondary | ICD-10-CM

## 2017-01-05 DIAGNOSIS — Z3A19 19 weeks gestation of pregnancy: Secondary | ICD-10-CM

## 2017-01-06 ENCOUNTER — Other Ambulatory Visit: Payer: Self-pay

## 2017-01-06 DIAGNOSIS — Z3400 Encounter for supervision of normal first pregnancy, unspecified trimester: Secondary | ICD-10-CM

## 2017-01-06 LAB — AFP, SERUM, OPEN SPINA BIFIDA
AFP MOM: 0.91
AFP Value: 63 ng/mL
Gest. Age on Collection Date: 18.6 weeks
Maternal Age At EDD: 23.2 yr
OSBR RISK 1 IN: 10000
Test Results:: NEGATIVE
WEIGHT: 97 [lb_av]

## 2017-01-17 ENCOUNTER — Telehealth: Payer: Self-pay | Admitting: *Deleted

## 2017-01-17 NOTE — Telephone Encounter (Signed)
Call placed to pt regarding her babyscript schedule. Pt made aware BP reading not entered in a few weeks. Pt states that she has forgotten due to  she has been traveling during the week between here and her hometown. Pt made aware importance to continue to enter readings in order to keep her on babyscript schedule. Pt states understanding and will try to enter reading today.

## 2017-01-31 ENCOUNTER — Ambulatory Visit (INDEPENDENT_AMBULATORY_CARE_PROVIDER_SITE_OTHER): Payer: BLUE CROSS/BLUE SHIELD | Admitting: Obstetrics & Gynecology

## 2017-01-31 VITALS — BP 107/69 | HR 77 | Wt 236.0 lb

## 2017-01-31 DIAGNOSIS — Z3402 Encounter for supervision of normal first pregnancy, second trimester: Secondary | ICD-10-CM

## 2017-01-31 DIAGNOSIS — O99210 Obesity complicating pregnancy, unspecified trimester: Secondary | ICD-10-CM | POA: Insufficient documentation

## 2017-01-31 DIAGNOSIS — Z3400 Encounter for supervision of normal first pregnancy, unspecified trimester: Secondary | ICD-10-CM

## 2017-01-31 DIAGNOSIS — O99212 Obesity complicating pregnancy, second trimester: Secondary | ICD-10-CM

## 2017-01-31 DIAGNOSIS — E669 Obesity, unspecified: Secondary | ICD-10-CM

## 2017-01-31 NOTE — Progress Notes (Signed)
   PRENATAL VISIT NOTE  Subjective:  Ann Ruiz is a 21 y.o. G1P0 at 3631w6d being seen today for ongoing prenatal care.  She is currently monitored for the following issues for this low-risk pregnancy and has Supervision of normal pregnancy, antepartum and Obesity in pregnancy on her problem list.  Patient reports no complaints.  Contractions: Not present. Vag. Bleeding: None.  Movement: Present. Denies leaking of fluid.   The following portions of the patient's history were reviewed and updated as appropriate: allergies, current medications, past family history, past medical history, past social history, past surgical history and problem list. Problem list updated.  Objective:   Vitals:   01/31/17 1318  BP: 107/69  Pulse: 77  Weight: 236 lb (107 kg)    Fetal Status: Fetal Heart Rate (bpm): 140   Movement: Present     General:  Alert, oriented and cooperative. Patient is in no acute distress.  Skin: Skin is warm and dry. No rash noted.   Cardiovascular: Normal heart rate noted  Respiratory: Normal respiratory effort, no problems with respiration noted  Abdomen: Soft, gravid, appropriate for gestational age. Pain/Pressure: Absent     Pelvic:  Cervical exam deferred        Extremities: Normal range of motion.     Mental Status: Normal mood and affect. Normal behavior. Normal judgment and thought content.   Assessment and Plan:  Pregnancy: G1P0 at 6231w6d  1. Supervision of normal first pregnancy, antepartum - 2 hour GTT, tdap, labs at next visit  2. Obesity in pregnancy  - Hemoglobin A1c  Preterm labor symptoms and general obstetric precautions including but not limited to vaginal bleeding, contractions, leaking of fluid and fetal movement were reviewed in detail with the patient. Please refer to After Visit Summary for other counseling recommendations.  Return in about 4 weeks (around 02/28/2017) for 2 hour GTT.   Allie BossierMyra C Jesseka Drinkard, MD

## 2017-01-31 NOTE — Progress Notes (Signed)
Pt has f/u ultrasound scheduled for 02/06/17 for anatomy f/u.

## 2017-02-01 LAB — HEMOGLOBIN A1C
ESTIMATED AVERAGE GLUCOSE: 82 mg/dL
HEMOGLOBIN A1C: 4.5 % — AB (ref 4.8–5.6)

## 2017-02-06 ENCOUNTER — Other Ambulatory Visit: Payer: Self-pay | Admitting: Obstetrics and Gynecology

## 2017-02-06 ENCOUNTER — Ambulatory Visit (HOSPITAL_COMMUNITY)
Admission: RE | Admit: 2017-02-06 | Discharge: 2017-02-06 | Disposition: A | Discharge status: Home / Self Care | Payer: BLUE CROSS/BLUE SHIELD | Source: Ambulatory Visit | Attending: Obstetrics and Gynecology | Admitting: Obstetrics and Gynecology

## 2017-02-06 DIAGNOSIS — Z362 Encounter for other antenatal screening follow-up: Secondary | ICD-10-CM | POA: Diagnosis not present

## 2017-02-06 DIAGNOSIS — Z3A23 23 weeks gestation of pregnancy: Secondary | ICD-10-CM | POA: Insufficient documentation

## 2017-02-06 DIAGNOSIS — Z3400 Encounter for supervision of normal first pregnancy, unspecified trimester: Secondary | ICD-10-CM

## 2017-02-28 ENCOUNTER — Other Ambulatory Visit: Payer: BLUE CROSS/BLUE SHIELD

## 2017-02-28 ENCOUNTER — Ambulatory Visit (INDEPENDENT_AMBULATORY_CARE_PROVIDER_SITE_OTHER): Payer: BLUE CROSS/BLUE SHIELD | Admitting: Obstetrics & Gynecology

## 2017-02-28 VITALS — BP 115/76 | HR 84 | Wt 249.6 lb

## 2017-02-28 DIAGNOSIS — Z23 Encounter for immunization: Secondary | ICD-10-CM

## 2017-02-28 DIAGNOSIS — Z3402 Encounter for supervision of normal first pregnancy, second trimester: Secondary | ICD-10-CM

## 2017-02-28 DIAGNOSIS — Z3400 Encounter for supervision of normal first pregnancy, unspecified trimester: Secondary | ICD-10-CM

## 2017-02-28 NOTE — Progress Notes (Signed)
   PRENATAL VISIT NOTE  Subjective:  Ann Ruiz is a 21 y.o. G1P0 at 1123w6d being seen today for ongoing prenatal care.  She is currently monitored for the following issues for this low-risk pregnancy and has Supervision of normal pregnancy, antepartum and Obesity in pregnancy on her problem list.  Patient reports no complaints.  Contractions: Not present. Vag. Bleeding: None.  Movement: Present. Denies leaking of fluid.   The following portions of the patient's history were reviewed and updated as appropriate: allergies, current medications, past family history, past medical history, past social history, past surgical history and problem list. Problem list updated.  Objective:   Vitals:   02/28/17 0854  BP: 115/76  Pulse: 84  Weight: 249 lb 9.6 oz (113.2 kg)    Fetal Status: Fetal Heart Rate (bpm): 150 Fundal Height: 27 cm Movement: Present     General:  Alert, oriented and cooperative. Patient is in no acute distress.  Skin: Skin is warm and dry. No rash noted.   Cardiovascular: Normal heart rate noted  Respiratory: Normal respiratory effort, no problems with respiration noted  Abdomen: Soft, gravid, appropriate for gestational age. Pain/Pressure: Absent     Pelvic:  Cervical exam deferred        Extremities: Normal range of motion.  Edema: None  Mental Status: Normal mood and affect. Normal behavior. Normal judgment and thought content.   Assessment and Plan:  Pregnancy: G1P0 at 4623w6d  1. Supervision of normal first pregnancy, antepartum Third trimester labs and Tdap today - CBC - HIV antibody - RPR - Glucose Tolerance, 2 Hours w/1 Hour Preterm labor symptoms and general obstetric precautions including but not limited to vaginal bleeding, contractions, leaking of fluid and fetal movement were reviewed in detail with the patient. Please refer to After Visit Summary for other counseling recommendations.  Return in about 3 weeks (around 03/21/2017) for OB Visit.   Jaynie CollinsUgonna  Rayyan Orsborn, MD

## 2017-02-28 NOTE — Patient Instructions (Signed)
Return to clinic for any scheduled appointments or obstetric concerns, or go to [facility] for evaluation   Third Trimester of Pregnancy The third trimester is from week 28 through week 40 (months 7 through 9). The third trimester is a time when the unborn baby (fetus) is growing rapidly. At the end of the ninth month, the fetus is about 20 inches in length and weighs 6-10 pounds. Body changes during your third trimester Your body will continue to go through many changes during pregnancy. The changes vary from woman to woman. During the third trimester:  Your weight will continue to increase. You can expect to gain 25-35 pounds (11-16 kg) by the end of the pregnancy.  You may begin to get stretch marks on your hips, abdomen, and breasts.  You may urinate more often because the fetus is moving lower into your pelvis and pressing on your bladder.  You may develop or continue to have heartburn. This is caused by increased hormones that slow down muscles in the digestive tract.  You may develop or continue to have constipation because increased hormones slow digestion and cause the muscles that push waste through your intestines to relax.  You may develop hemorrhoids. These are swollen veins (varicose veins) in the rectum that can itch or be painful.  You may develop swollen, bulging veins (varicose veins) in your legs.  You may have increased body aches in the pelvis, back, or thighs. This is due to weight gain and increased hormones that are relaxing your joints.  You may have changes in your hair. These can include thickening of your hair, rapid growth, and changes in texture. Some women also have hair loss during or after pregnancy, or hair that feels dry or thin. Your hair will most likely return to normal after your baby is born.  Your breasts will continue to grow and they will continue to become tender. A yellow fluid (colostrum) may leak from your breasts. This is the first milk you are  producing for your baby.  Your belly button may stick out.  You may notice more swelling in your hands, face, or ankles.  You may have increased tingling or numbness in your hands, arms, and legs. The skin on your belly may also feel numb.  You may feel short of breath because of your expanding uterus.  You may have more problems sleeping. This can be caused by the size of your belly, increased need to urinate, and an increase in your body's metabolism.  You may notice the fetus "dropping," or moving lower in your abdomen (lightening).  You may have increased vaginal discharge.  You may notice your joints feel loose and you may have pain around your pelvic bone.  What to expect at prenatal visits You will have prenatal exams every 2 weeks until week 36. Then you will have weekly prenatal exams. During a routine prenatal visit:  You will be weighed to make sure you and the baby are growing normally.  Your blood pressure will be taken.  Your abdomen will be measured to track your baby's growth.  The fetal heartbeat will be listened to.  Any test results from the previous visit will be discussed.  You may have a cervical check near your due date to see if your cervix has softened or thinned (effaced).  You will be tested for Group B streptococcus. This happens between 35 and 37 weeks.  Your health care provider may ask you:  What your birth plan is.    How you are feeling.  If you are feeling the baby move.  If you have had any abnormal symptoms, such as leaking fluid, bleeding, severe headaches, or abdominal cramping.  If you are using any tobacco products, including cigarettes, chewing tobacco, and electronic cigarettes.  If you have any questions.  Other tests or screenings that may be performed during your third trimester include:  Blood tests that check for low iron levels (anemia).  Fetal testing to check the health, activity level, and growth of the fetus.  Testing is done if you have certain medical conditions or if there are problems during the pregnancy.  Nonstress test (NST). This test checks the health of your baby to make sure there are no signs of problems, such as the baby not getting enough oxygen. During this test, a belt is placed around your belly. The baby is made to move, and its heart rate is monitored during movement.  What is false labor? False labor is a condition in which you feel small, irregular tightenings of the muscles in the womb (contractions) that usually go away with rest, changing position, or drinking water. These are called Braxton Hicks contractions. Contractions may last for hours, days, or even weeks before true labor sets in. If contractions come at regular intervals, become more frequent, increase in intensity, or become painful, you should see your health care provider. What are the signs of labor?  Abdominal cramps.  Regular contractions that start at 10 minutes apart and become stronger and more frequent with time.  Contractions that start on the top of the uterus and spread down to the lower abdomen and back.  Increased pelvic pressure and dull back pain.  A watery or bloody mucus discharge that comes from the vagina.  Leaking of amniotic fluid. This is also known as your "water breaking." It could be a slow trickle or a gush. Let your health care provider know if it has a color or strange odor. If you have any of these signs, call your health care provider right away, even if it is before your due date. Follow these instructions at home: Medicines  Follow your health care provider's instructions regarding medicine use. Specific medicines may be either safe or unsafe to take during pregnancy.  Take a prenatal vitamin that contains at least 600 micrograms (mcg) of folic acid.  If you develop constipation, try taking a stool softener if your health care provider approves. Eating and drinking  Eat a  balanced diet that includes fresh fruits and vegetables, whole grains, good sources of protein such as meat, eggs, or tofu, and low-fat dairy. Your health care provider will help you determine the amount of weight gain that is right for you.  Avoid raw meat and uncooked cheese. These carry germs that can cause birth defects in the baby.  If you have low calcium intake from food, talk to your health care provider about whether you should take a daily calcium supplement.  Eat four or five small meals rather than three large meals a day.  Limit foods that are high in fat and processed sugars, such as fried and sweet foods.  To prevent constipation: ? Drink enough fluid to keep your urine clear or pale yellow. ? Eat foods that are high in fiber, such as fresh fruits and vegetables, whole grains, and beans. Activity  Exercise only as directed by your health care provider. Most women can continue their usual exercise routine during pregnancy. Try to exercise for 30   minutes at least 5 days a week. Stop exercising if you experience uterine contractions.  Avoid heavy lifting.  Do not exercise in extreme heat or humidity, or at high altitudes.  Wear low-heel, comfortable shoes.  Practice good posture.  You may continue to have sex unless your health care provider tells you otherwise. Relieving pain and discomfort  Take frequent breaks and rest with your legs elevated if you have leg cramps or low back pain.  Take warm sitz baths to soothe any pain or discomfort caused by hemorrhoids. Use hemorrhoid cream if your health care provider approves.  Wear a good support bra to prevent discomfort from breast tenderness.  If you develop varicose veins: ? Wear support pantyhose or compression stockings as told by your healthcare provider. ? Elevate your feet for 15 minutes, 3-4 times a day. Prenatal care  Write down your questions. Take them to your prenatal visits.  Keep all your prenatal  visits as told by your health care provider. This is important. Safety  Wear your seat belt at all times when driving.  Make a list of emergency phone numbers, including numbers for family, friends, the hospital, and police and fire departments. General instructions  Avoid cat litter boxes and soil used by cats. These carry germs that can cause birth defects in the baby. If you have a cat, ask someone to clean the litter box for you.  Do not travel far distances unless it is absolutely necessary and only with the approval of your health care provider.  Do not use hot tubs, steam rooms, or saunas.  Do not drink alcohol.  Do not use any products that contain nicotine or tobacco, such as cigarettes and e-cigarettes. If you need help quitting, ask your health care provider.  Do not use any medicinal herbs or unprescribed drugs. These chemicals affect the formation and growth of the baby.  Do not douche or use tampons or scented sanitary pads.  Do not cross your legs for long periods of time.  To prepare for the arrival of your baby: ? Take prenatal classes to understand, practice, and ask questions about labor and delivery. ? Make a trial run to the hospital. ? Visit the hospital and tour the maternity area. ? Arrange for maternity or paternity leave through employers. ? Arrange for family and friends to take care of pets while you are in the hospital. ? Purchase a rear-facing car seat and make sure you know how to install it in your car. ? Pack your hospital bag. ? Prepare the baby's nursery. Make sure to remove all pillows and stuffed animals from the baby's crib to prevent suffocation.  Visit your dentist if you have not gone during your pregnancy. Use a soft toothbrush to brush your teeth and be gentle when you floss. Contact a health care provider if:  You are unsure if you are in labor or if your water has broken.  You become dizzy.  You have mild pelvic cramps, pelvic  pressure, or nagging pain in your abdominal area.  You have lower back pain.  You have persistent nausea, vomiting, or diarrhea.  You have an unusual or bad smelling vaginal discharge.  You have pain when you urinate. Get help right away if:  Your water breaks before 37 weeks.  You have regular contractions less than 5 minutes apart before 37 weeks.  You have a fever.  You are leaking fluid from your vagina.  You have spotting or bleeding from your vagina.    You have severe abdominal pain or cramping.  You have rapid weight loss or weight gain.  You have shortness of breath with chest pain.  You notice sudden or extreme swelling of your face, hands, ankles, feet, or legs.  Your baby makes fewer than 10 movements in 2 hours.  You have severe headaches that do not go away when you take medicine.  You have vision changes. Summary  The third trimester is from week 28 through week 40, months 7 through 9. The third trimester is a time when the unborn baby (fetus) is growing rapidly.  During the third trimester, your discomfort may increase as you and your baby continue to gain weight. You may have abdominal, leg, and back pain, sleeping problems, and an increased need to urinate.  During the third trimester your breasts will keep growing and they will continue to become tender. A yellow fluid (colostrum) may leak from your breasts. This is the first milk you are producing for your baby.  False labor is a condition in which you feel small, irregular tightenings of the muscles in the womb (contractions) that eventually go away. These are called Braxton Hicks contractions. Contractions may last for hours, days, or even weeks before true labor sets in.  Signs of labor can include: abdominal cramps; regular contractions that start at 10 minutes apart and become stronger and more frequent with time; watery or bloody mucus discharge that comes from the vagina; increased pelvic pressure  and dull back pain; and leaking of amniotic fluid. This information is not intended to replace advice given to you by your health care provider. Make sure you discuss any questions you have with your health care provider. Document Released: 08/02/2001 Document Revised: 01/14/2016 Document Reviewed: 10/09/2012 [employer] Interactive Patient Education  2017 [employer]  

## 2017-03-01 LAB — GLUCOSE TOLERANCE, 2 HOURS W/ 1HR
GLUCOSE, 2 HOUR: 57 mg/dL — AB (ref 65–152)
Glucose, 1 hour: 94 mg/dL (ref 65–179)
Glucose, Fasting: 77 mg/dL (ref 65–91)

## 2017-03-01 LAB — RPR: RPR: NONREACTIVE

## 2017-03-01 LAB — HIV ANTIBODY (ROUTINE TESTING W REFLEX): HIV SCREEN 4TH GENERATION: NONREACTIVE

## 2017-03-01 LAB — CBC
Hematocrit: 32.9 % — ABNORMAL LOW (ref 34.0–46.6)
Hemoglobin: 10.7 g/dL — ABNORMAL LOW (ref 11.1–15.9)
MCH: 30.7 pg (ref 26.6–33.0)
MCHC: 32.5 g/dL (ref 31.5–35.7)
MCV: 95 fL (ref 79–97)
Platelets: 223 10*3/uL (ref 150–379)
RBC: 3.48 x10E6/uL — ABNORMAL LOW (ref 3.77–5.28)
RDW: 13.6 % (ref 12.3–15.4)
WBC: 7.6 10*3/uL (ref 3.4–10.8)

## 2017-03-21 ENCOUNTER — Ambulatory Visit (INDEPENDENT_AMBULATORY_CARE_PROVIDER_SITE_OTHER): Payer: BLUE CROSS/BLUE SHIELD | Admitting: Obstetrics and Gynecology

## 2017-03-21 VITALS — BP 117/74 | HR 90 | Wt 249.3 lb

## 2017-03-21 DIAGNOSIS — Z3403 Encounter for supervision of normal first pregnancy, third trimester: Secondary | ICD-10-CM

## 2017-03-21 DIAGNOSIS — Z3400 Encounter for supervision of normal first pregnancy, unspecified trimester: Secondary | ICD-10-CM

## 2017-03-21 NOTE — Progress Notes (Signed)
Subjective:  Ann Ruiz is a 21 y.o. G1P0 at 5843w6d being seen today for ongoing prenatal care.  She is currently monitored for the following issues for this low-risk pregnancy and has Supervision of normal pregnancy, antepartum and Obesity in pregnancy on her problem list.  Patient reports no complaints.  Contractions: Not present. Vag. Bleeding: None.  Movement: Present. Denies leaking of fluid.   The following portions of the patient's history were reviewed and updated as appropriate: allergies, current medications, past family history, past medical history, past social history, past surgical history and problem list. Problem list updated.  Objective:   Vitals:   03/21/17 0845  BP: 117/74  Pulse: 90  Weight: 249 lb 4.8 oz (113.1 kg)    Fetal Status: Fetal Heart Rate (bpm): 145   Movement: Present     General:  Alert, oriented and cooperative. Patient is in no acute distress.  Skin: Skin is warm and dry. No rash noted.   Cardiovascular: Normal heart rate noted  Respiratory: Normal respiratory effort, no problems with respiration noted  Abdomen: Soft, gravid, appropriate for gestational age. Pain/Pressure: Present     Pelvic:  Cervical exam deferred        Extremities: Normal range of motion.  Edema: None  Mental Status: Normal mood and affect. Normal behavior. Normal judgment and thought content.   Urinalysis:      Assessment and Plan:  Pregnancy: G1P0 at 7343w6d  1. Supervision of normal first pregnancy, antepartum Stable   Preterm labor symptoms and general obstetric precautions including but not limited to vaginal bleeding, contractions, leaking of fluid and fetal movement were reviewed in detail with the patient. Please refer to After Visit Summary for other counseling recommendations.  Return in about 2 weeks (around 04/04/2017) for OB visit.   Hermina StaggersErvin, Leen Tworek L, MD

## 2017-03-21 NOTE — Patient Instructions (Signed)
Third Trimester of Pregnancy The third trimester is from week 28 through week 40 (months 7 through 9). The third trimester is a time when the unborn baby (fetus) is growing rapidly. At the end of the ninth month, the fetus is about 20 inches in length and weighs 6-10 pounds. Body changes during your third trimester Your body will continue to go through many changes during pregnancy. The changes vary from woman to woman. During the third trimester:  Your weight will continue to increase. You can expect to gain 25-35 pounds (11-16 kg) by the end of the pregnancy.  You may begin to get stretch marks on your hips, abdomen, and breasts.  You may urinate more often because the fetus is moving lower into your pelvis and pressing on your bladder.  You may develop or continue to have heartburn. This is caused by increased hormones that slow down muscles in the digestive tract.  You may develop or continue to have constipation because increased hormones slow digestion and cause the muscles that push waste through your intestines to relax.  You may develop hemorrhoids. These are swollen veins (varicose veins) in the rectum that can itch or be painful.  You may develop swollen, bulging veins (varicose veins) in your legs.  You may have increased body aches in the pelvis, back, or thighs. This is due to weight gain and increased hormones that are relaxing your joints.  You may have changes in your hair. These can include thickening of your hair, rapid growth, and changes in texture. Some women also have hair loss during or after pregnancy, or hair that feels dry or thin. Your hair will most likely return to normal after your baby is born.  Your breasts will continue to grow and they will continue to become tender. A yellow fluid (colostrum) may leak from your breasts. This is the first milk you are producing for your baby.  Your belly button may stick out.  You may notice more swelling in your hands,  face, or ankles.  You may have increased tingling or numbness in your hands, arms, and legs. The skin on your belly may also feel numb.  You may feel short of breath because of your expanding uterus.  You may have more problems sleeping. This can be caused by the size of your belly, increased need to urinate, and an increase in your body's metabolism.  You may notice the fetus "dropping," or moving lower in your abdomen (lightening).  You may have increased vaginal discharge.  You may notice your joints feel loose and you may have pain around your pelvic bone.  What to expect at prenatal visits You will have prenatal exams every 2 weeks until week 36. Then you will have weekly prenatal exams. During a routine prenatal visit:  You will be weighed to make sure you and the baby are growing normally.  Your blood pressure will be taken.  Your abdomen will be measured to track your baby's growth.  The fetal heartbeat will be listened to.  Any test results from the previous visit will be discussed.  You may have a cervical check near your due date to see if your cervix has softened or thinned (effaced).  You will be tested for Group B streptococcus. This happens between 35 and 37 weeks.  Your health care provider may ask you:  What your birth plan is.  How you are feeling.  If you are feeling the baby move.  If you have had   any abnormal symptoms, such as leaking fluid, bleeding, severe headaches, or abdominal cramping.  If you are using any tobacco products, including cigarettes, chewing tobacco, and electronic cigarettes.  If you have any questions.  Other tests or screenings that may be performed during your third trimester include:  Blood tests that check for low iron levels (anemia).  Fetal testing to check the health, activity level, and growth of the fetus. Testing is done if you have certain medical conditions or if there are problems during the  pregnancy.  Nonstress test (NST). This test checks the health of your baby to make sure there are no signs of problems, such as the baby not getting enough oxygen. During this test, a belt is placed around your belly. The baby is made to move, and its heart rate is monitored during movement.  What is false labor? False labor is a condition in which you feel small, irregular tightenings of the muscles in the womb (contractions) that usually go away with rest, changing position, or drinking water. These are called Braxton Hicks contractions. Contractions may last for hours, days, or even weeks before true labor sets in. If contractions come at regular intervals, become more frequent, increase in intensity, or become painful, you should see your health care provider. What are the signs of labor?  Abdominal cramps.  Regular contractions that start at 10 minutes apart and become stronger and more frequent with time.  Contractions that start on the top of the uterus and spread down to the lower abdomen and back.  Increased pelvic pressure and dull back pain.  A watery or bloody mucus discharge that comes from the vagina.  Leaking of amniotic fluid. This is also known as your "water breaking." It could be a slow trickle or a gush. Let your health care provider know if it has a color or strange odor. If you have any of these signs, call your health care provider right away, even if it is before your due date. Follow these instructions at home: Medicines  Follow your health care provider's instructions regarding medicine use. Specific medicines may be either safe or unsafe to take during pregnancy.  Take a prenatal vitamin that contains at least 600 micrograms (mcg) of folic acid.  If you develop constipation, try taking a stool softener if your health care provider approves. Eating and drinking  Eat a balanced diet that includes fresh fruits and vegetables, whole grains, good sources of protein  such as meat, eggs, or tofu, and low-fat dairy. Your health care provider will help you determine the amount of weight gain that is right for you.  Avoid raw meat and uncooked cheese. These carry germs that can cause birth defects in the baby.  If you have low calcium intake from food, talk to your health care provider about whether you should take a daily calcium supplement.  Eat four or five small meals rather than three large meals a day.  Limit foods that are high in fat and processed sugars, such as fried and sweet foods.  To prevent constipation: ? Drink enough fluid to keep your urine clear or pale yellow. ? Eat foods that are high in fiber, such as fresh fruits and vegetables, whole grains, and beans. Activity  Exercise only as directed by your health care provider. Most women can continue their usual exercise routine during pregnancy. Try to exercise for 30 minutes at least 5 days a week. Stop exercising if you experience uterine contractions.  Avoid heavy   lifting.  Do not exercise in extreme heat or humidity, or at high altitudes.  Wear low-heel, comfortable shoes.  Practice good posture.  You may continue to have sex unless your health care provider tells you otherwise. Relieving pain and discomfort  Take frequent breaks and rest with your legs elevated if you have leg cramps or low back pain.  Take warm sitz baths to soothe any pain or discomfort caused by hemorrhoids. Use hemorrhoid cream if your health care provider approves.  Wear a good support bra to prevent discomfort from breast tenderness.  If you develop varicose veins: ? Wear support pantyhose or compression stockings as told by your healthcare provider. ? Elevate your feet for 15 minutes, 3-4 times a day. Prenatal care  Write down your questions. Take them to your prenatal visits.  Keep all your prenatal visits as told by your health care provider. This is important. Safety  Wear your seat belt at  all times when driving.  Make a list of emergency phone numbers, including numbers for family, friends, the hospital, and police and fire departments. General instructions  Avoid cat litter boxes and soil used by cats. These carry germs that can cause birth defects in the baby. If you have a cat, ask someone to clean the litter box for you.  Do not travel far distances unless it is absolutely necessary and only with the approval of your health care provider.  Do not use hot tubs, steam rooms, or saunas.  Do not drink alcohol.  Do not use any products that contain nicotine or tobacco, such as cigarettes and e-cigarettes. If you need help quitting, ask your health care provider.  Do not use any medicinal herbs or unprescribed drugs. These chemicals affect the formation and growth of the baby.  Do not douche or use tampons or scented sanitary pads.  Do not cross your legs for long periods of time.  To prepare for the arrival of your baby: ? Take prenatal classes to understand, practice, and ask questions about labor and delivery. ? Make a trial run to the hospital. ? Visit the hospital and tour the maternity area. ? Arrange for maternity or paternity leave through employers. ? Arrange for family and friends to take care of pets while you are in the hospital. ? Purchase a rear-facing car seat and make sure you know how to install it in your car. ? Pack your hospital bag. ? Prepare the baby's nursery. Make sure to remove all pillows and stuffed animals from the baby's crib to prevent suffocation.  Visit your dentist if you have not gone during your pregnancy. Use a soft toothbrush to brush your teeth and be gentle when you floss. Contact a health care provider if:  You are unsure if you are in labor or if your water has broken.  You become dizzy.  You have mild pelvic cramps, pelvic pressure, or nagging pain in your abdominal area.  You have lower back pain.  You have persistent  nausea, vomiting, or diarrhea.  You have an unusual or bad smelling vaginal discharge.  You have pain when you urinate. Get help right away if:  Your water breaks before 37 weeks.  You have regular contractions less than 5 minutes apart before 37 weeks.  You have a fever.  You are leaking fluid from your vagina.  You have spotting or bleeding from your vagina.  You have severe abdominal pain or cramping.  You have rapid weight loss or weight gain.    You have shortness of breath with chest pain.  You notice sudden or extreme swelling of your face, hands, ankles, feet, or legs.  Your baby makes fewer than 10 movements in 2 hours.  You have severe headaches that do not go away when you take medicine.  You have vision changes. Summary  The third trimester is from week 28 through week 40, months 7 through 9. The third trimester is a time when the unborn baby (fetus) is growing rapidly.  During the third trimester, your discomfort may increase as you and your baby continue to gain weight. You may have abdominal, leg, and back pain, sleeping problems, and an increased need to urinate.  During the third trimester your breasts will keep growing and they will continue to become tender. A yellow fluid (colostrum) may leak from your breasts. This is the first milk you are producing for your baby.  False labor is a condition in which you feel small, irregular tightenings of the muscles in the womb (contractions) that eventually go away. These are called Braxton Hicks contractions. Contractions may last for hours, days, or even weeks before true labor sets in.  Signs of labor can include: abdominal cramps; regular contractions that start at 10 minutes apart and become stronger and more frequent with time; watery or bloody mucus discharge that comes from the vagina; increased pelvic pressure and dull back pain; and leaking of amniotic fluid. This information is not intended to replace advice  given to you by your health care provider. Make sure you discuss any questions you have with your health care provider. Document Released: 08/02/2001 Document Revised: 01/14/2016 Document Reviewed: 10/09/2012 [employer] Interactive Patient Education  2017 [employer]  

## 2017-04-04 ENCOUNTER — Encounter: Payer: BLUE CROSS/BLUE SHIELD | Admitting: Obstetrics & Gynecology

## 2017-04-13 ENCOUNTER — Telehealth: Payer: Self-pay | Admitting: *Deleted

## 2017-04-13 NOTE — Telephone Encounter (Signed)
Called Pt and she is transferring to [place] and I gave it to [employer] to bill her.

## 2017-08-23 ENCOUNTER — Encounter (HOSPITAL_COMMUNITY): Payer: Self-pay

## 2019-01-18 IMAGING — US US MFM OB COMP +14 WKS
1 series · 14 of 28 positions shown · non-contrast
Comparison: none

[Series 1: us mfm ob comp +14 wks · 107 acquisitions, 14 frames shown]
[im 4/107]
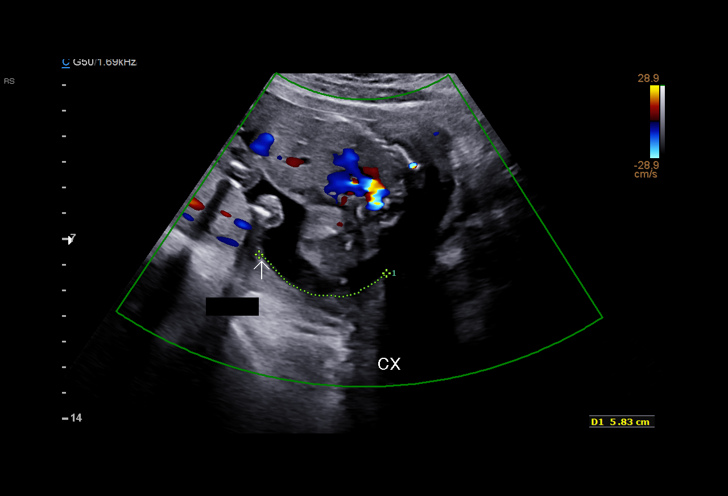
[im 12/107]
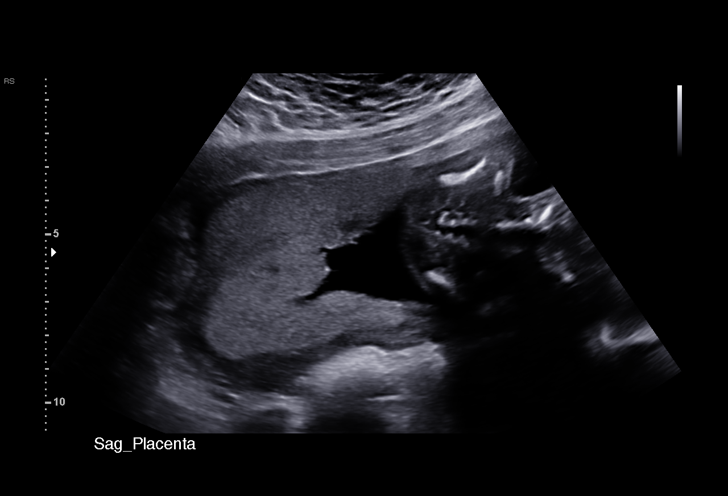
[im 20/107]
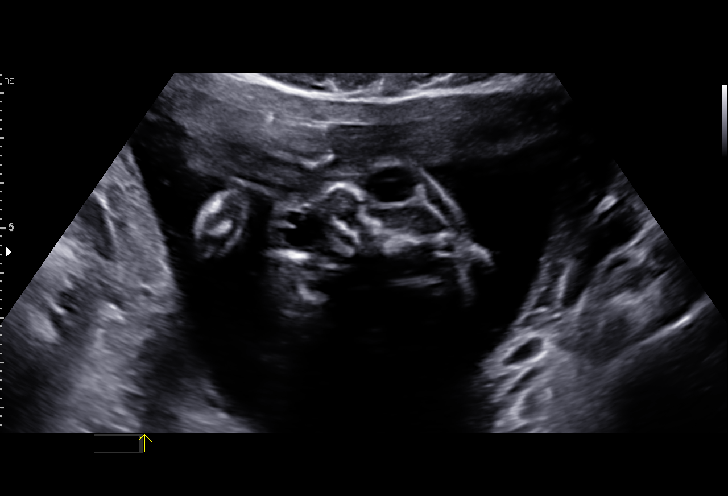
[im 28/107]
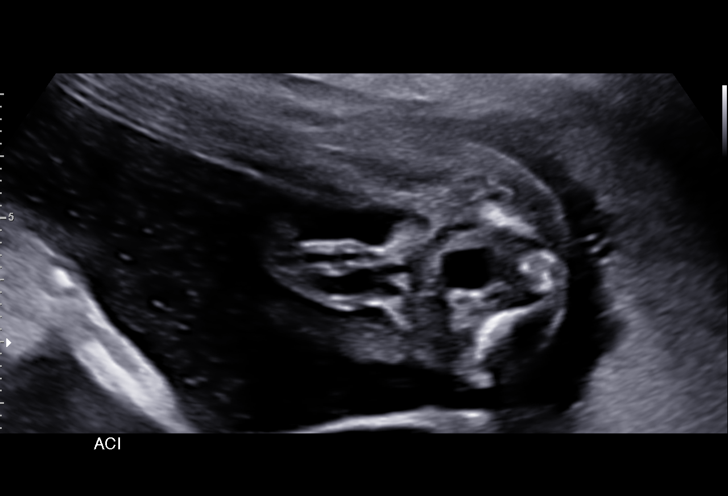
[im 36/107]
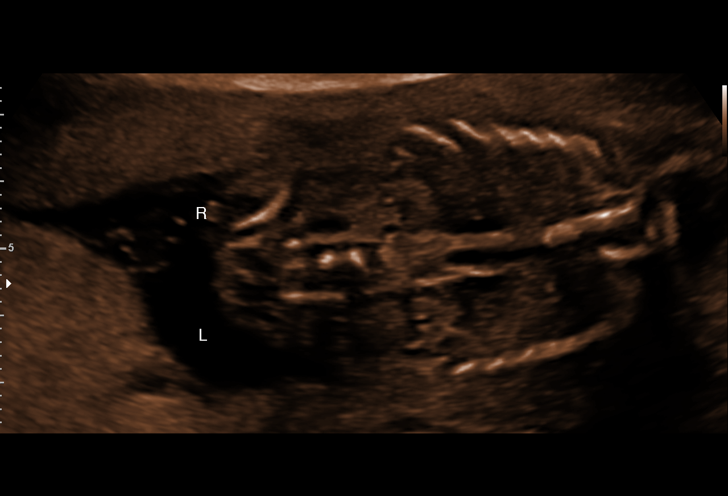
[im 44/107]
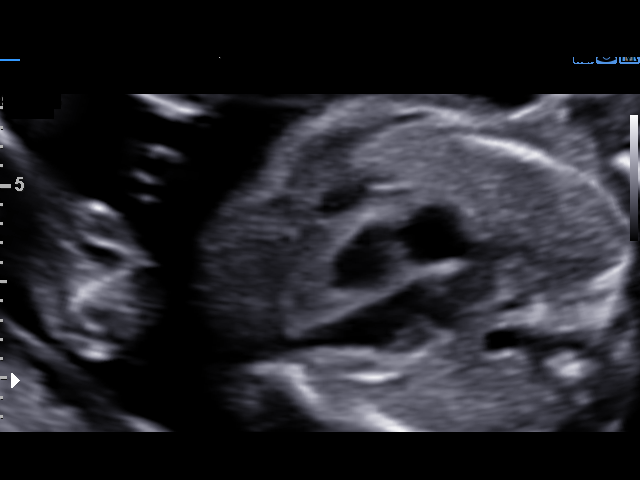
[im 52/107]
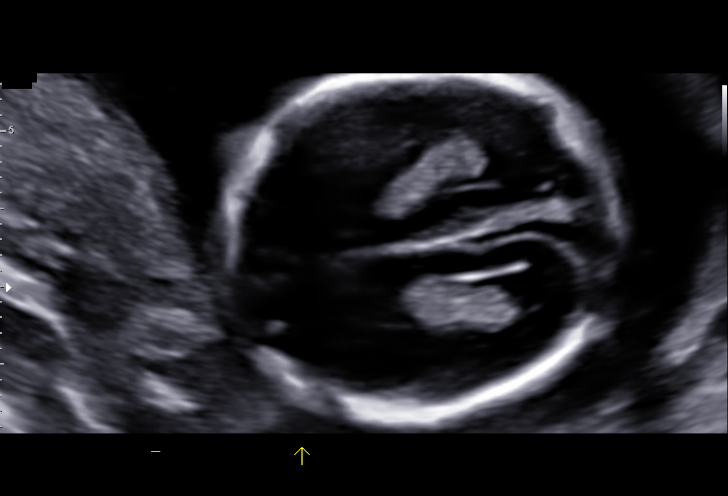
[im 59/107]
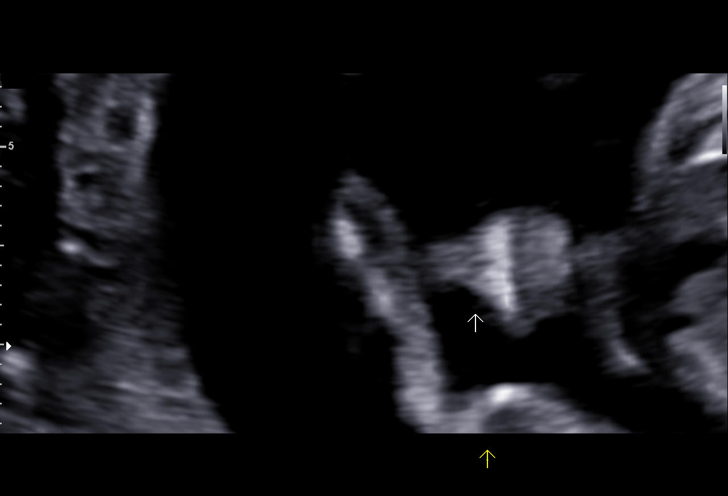
[im 67/107]
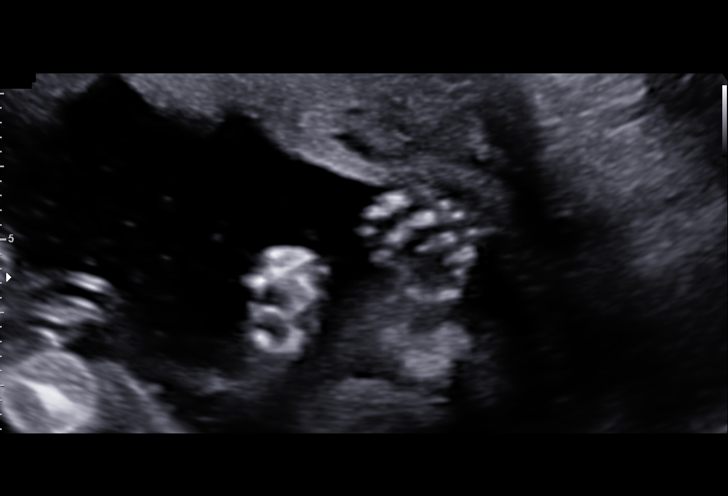
[im 75/107]
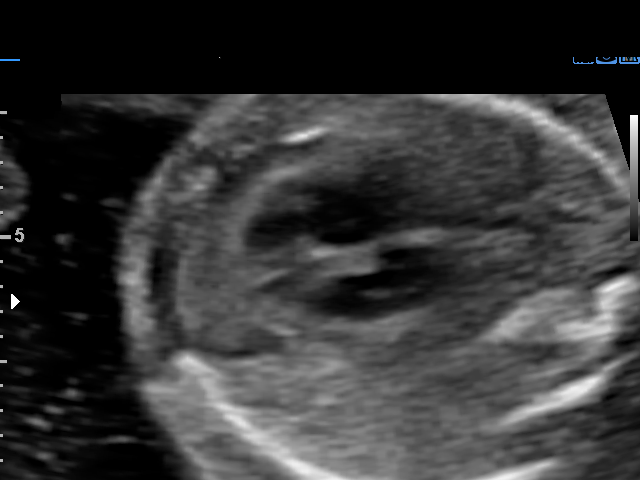
[im 83/107]
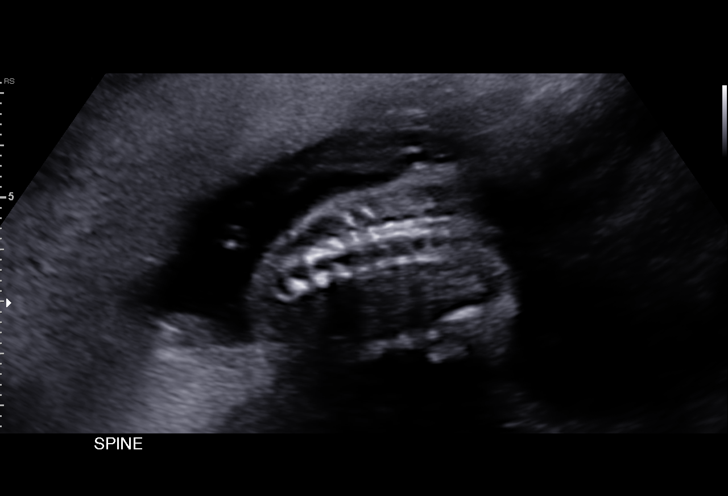
[im 91/107]
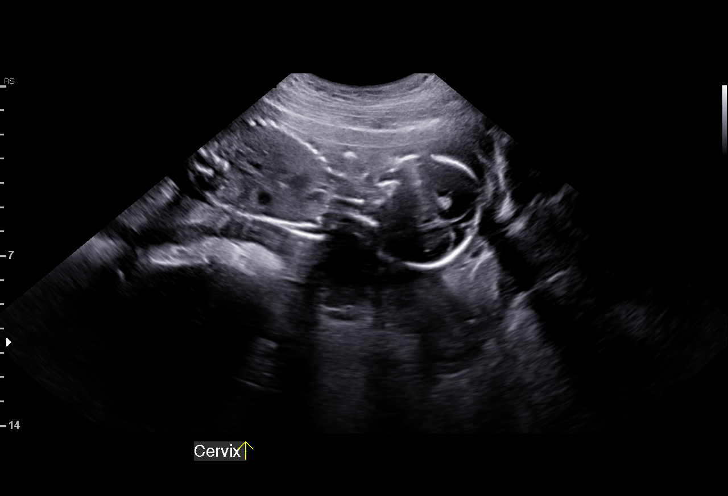
[im 99/107]
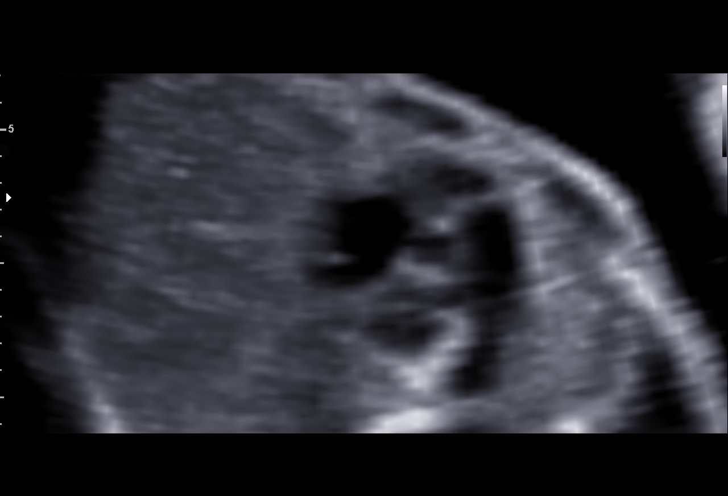
[im 107/107]
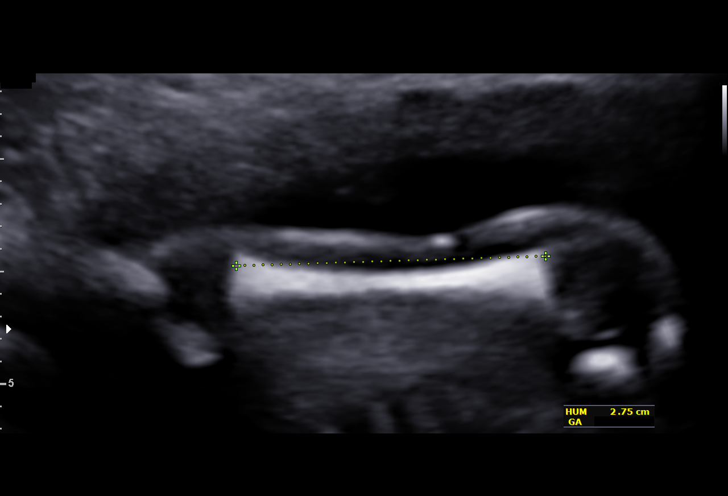

[14 of 28 positions shown; findings below may reference images not displayed]

1  LESYA AUJLA            [id number]      [id number]     [id number]
Indications

18 weeks gestation of pregnancy
Encounter for antenatal screening for
malformations
OB History

Blood Type:            Height:  5'9"   Weight (lb):  202      BMI:
Gravidity:    1         Term:   0        Prem:   0        SAB:   0
TOP:          0       Ectopic:  0        Living: 0
Fetal Evaluation

Num Of Fetuses:     1
Fetal Heart         158
Rate(bpm):
Cardiac Activity:   Observed
Presentation:       Cephalic
Placenta:           Posterior Fundal, above cervical os
P. Cord Insertion:  Visualized, central

Amniotic Fluid
AFI FV:      Subjectively within normal limits

Largest Pocket(cm)
4.88
Biometry

BPD:      45.3  mm     G. Age:  19w 5d         90  %    CI:        73.04   %   70 - 86
FL/HC:      18.2   %   16.1 -
HC:      168.5  mm     G. Age:  19w 4d         83  %    HC/AC:      1.21       1.09 -
AC:      139.8  mm     G. Age:  19w 3d         73  %    FL/BPD:     67.8   %
FL:       30.7  mm     G. Age:  19w 4d         77  %    FL/AC:      22.0   %   20 - 24
HUM:      27.7  mm     G. Age:  18w 6d         61  %

Est. FW:     293  gm    0 lb 10 oz      60  %
Gestational Age

LMP:           18w 4d       Date:   08/28/16                 EDD:   06/04/17
U/S Today:     19w 4d                                        EDD:   05/28/17
Best:          18w 4d    Det. By:   LMP  (08/28/16)          EDD:   06/04/17
Anatomy

Cranium:               Appears normal         Aortic Arch:            Appears normal
Cavum:                 Appears normal         Ductal Arch:            Appears normal
Ventricles:            Appears normal         Diaphragm:              Appears normal
Choroid Plexus:        Appears normal         Stomach:                Appears normal, left
sided
Cerebellum:            Not well visualized    Abdomen:                Appears normal
Posterior Fossa:       Not well visualized    Abdominal Wall:         Appears nml (cord
insert, abd wall)
Nuchal Fold:           Not well visualized    Cord Vessels:           Appears normal (3
vessel cord)
Face:                  Appears normal         Kidneys:                Appear normal
(orbits and profile)
Lips:                  Not well visualized    Bladder:                Appears normal
Thoracic:              Appears normal         Spine:                  Not well visualized
Heart:                 Not well visualized    Upper Extremities:      Appears normal
RVOT:                  Appears normal         Lower Extremities:      Appears normal
LVOT:                  Appears normal

Other:  Fetus appears to be a female. Heels and 5th digit visualized. Open
hands visualized. Nasal bone visualized. Technically difficult due to
fetal position.
Cervix Uterus Adnexa

Cervix
Length:           3.64  cm.
Normal appearance by transabdominal scan.

Uterus
No abnormality visualized.

Left Ovary
Simple cyst measuring 3.0 x 1.8 x 2.9cm

Right Ovary
Not visualized.

Adnexa:       No abnormality visualized. No adnexal mass
visualized.
Impression

Singleton intrauterine pregnancy at 18+4, here for anatomic
survey
Review of the anatomy shows no sonographic markers for
aneuploidy or structural anomalies
However, evaluation of fetal heart and spine should be
considered suboptimal secondary to fetal position
Amniotic fluid volume and placentation is normal
Estimated fetal weight is 293g which is growth in the 60th
percentile
Recommendations

Recommend repeat scan in 4 weeks to complete the antomic
survey

## 2019-02-19 IMAGING — US US MFM OB FOLLOW-UP
1 series · 14 of 28 positions shown · non-contrast
Comparison: none

[Series 1: us mfm ob follow-up · 51 acquisitions, 14 frames shown]
[im 2/51]
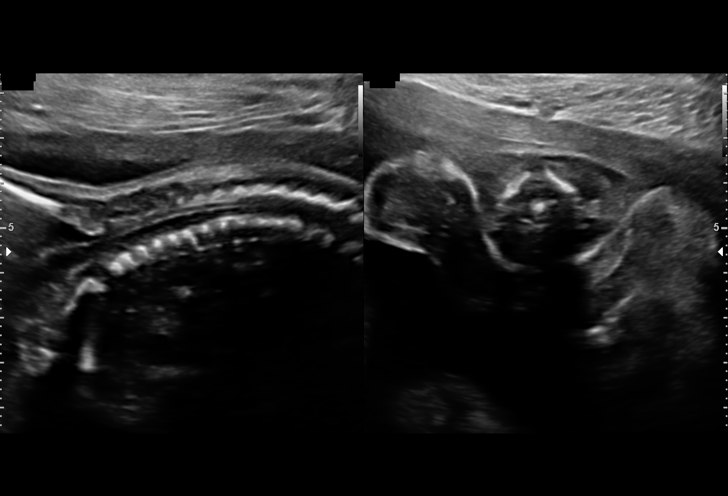
[im 6/51]
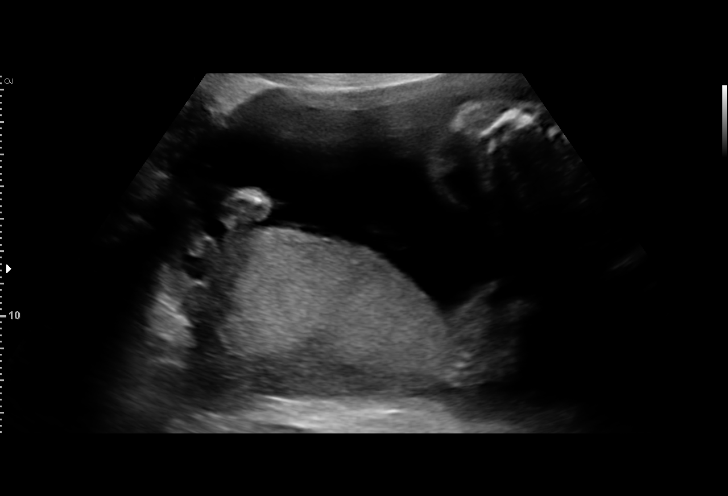
[im 10/51]
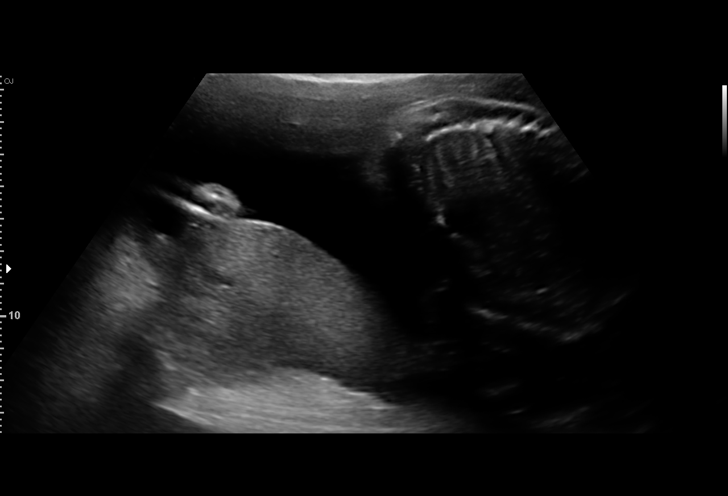
[im 13/51]
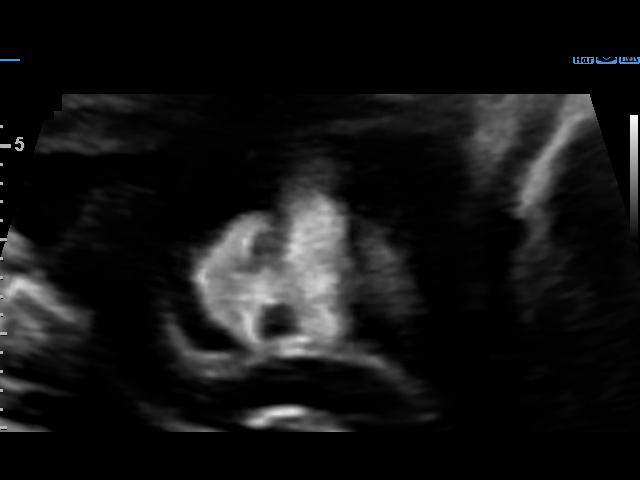
[im 17/51]
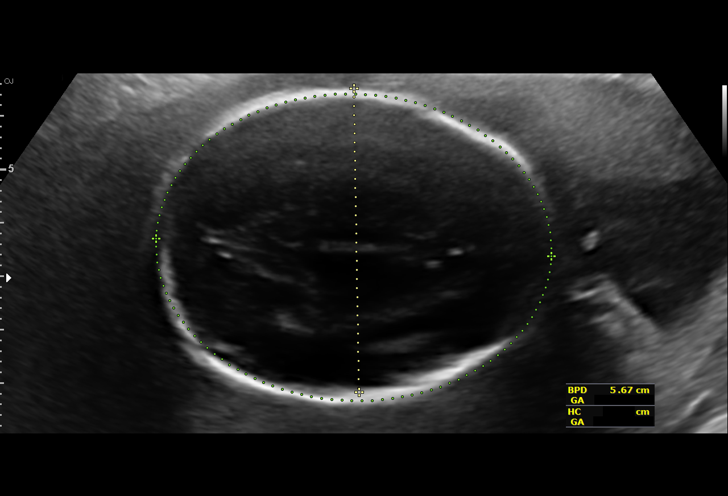
[im 21/51]
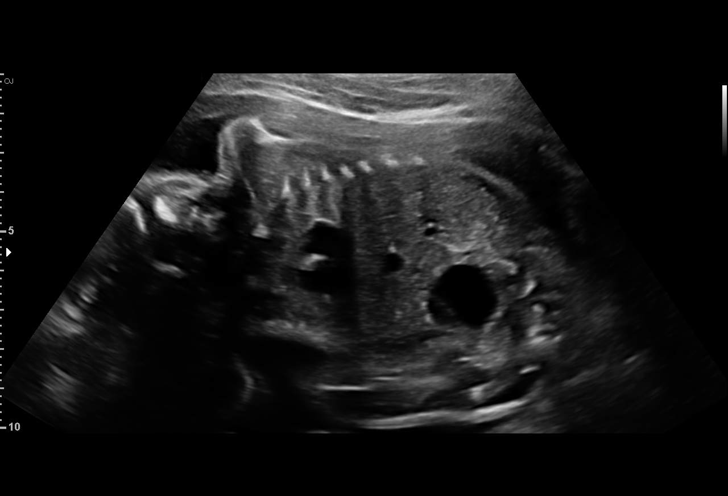
[im 25/51]
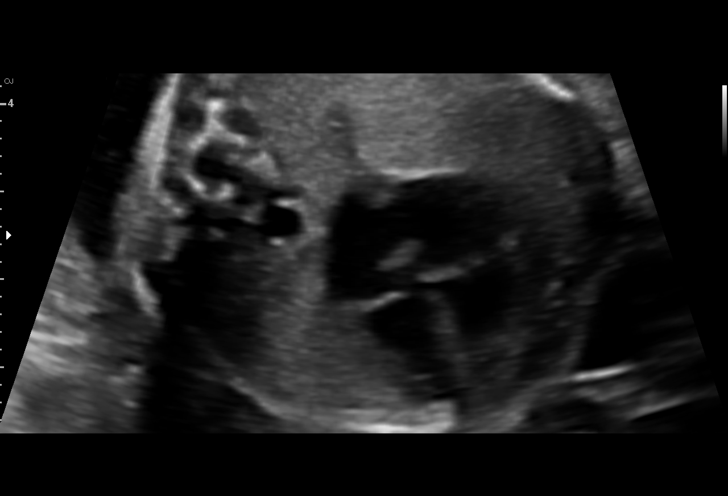
[im 28/51]
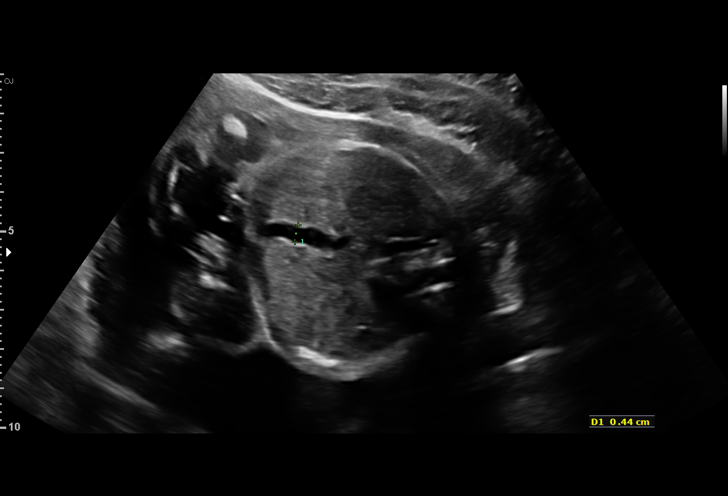
[im 32/51]
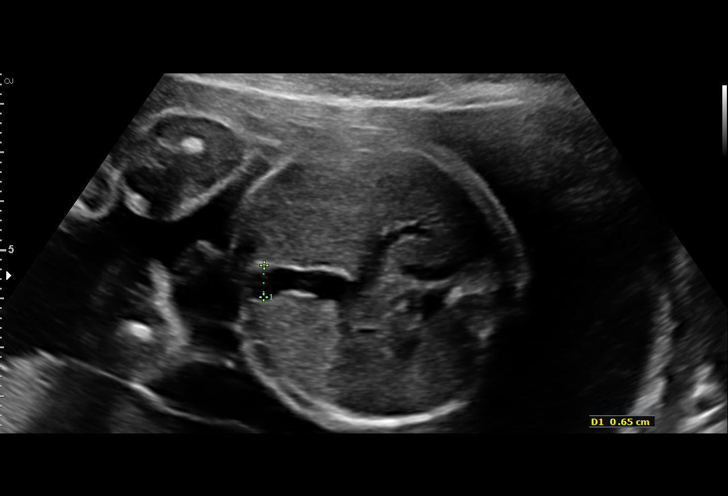
[im 36/51]
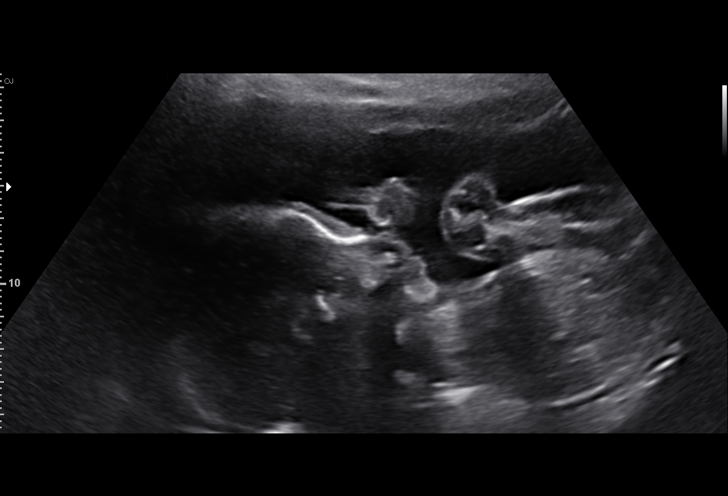
[im 39/51]
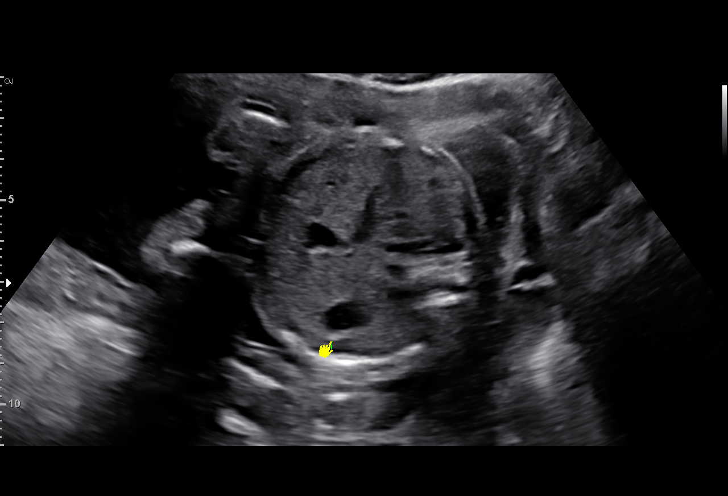
[im 43/51]
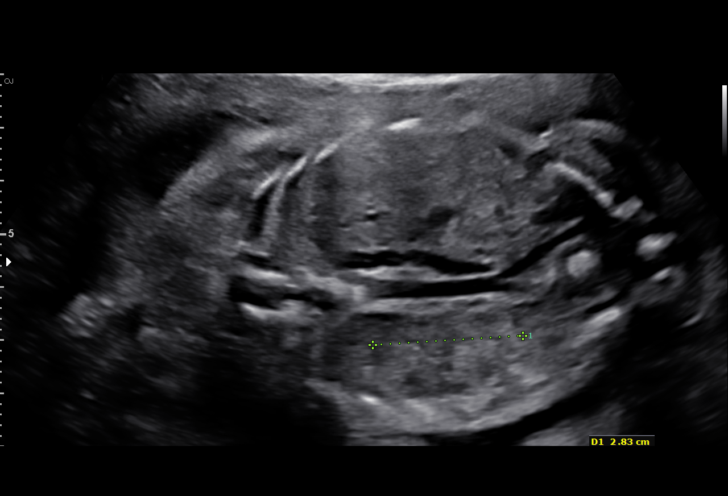
[im 47/51]
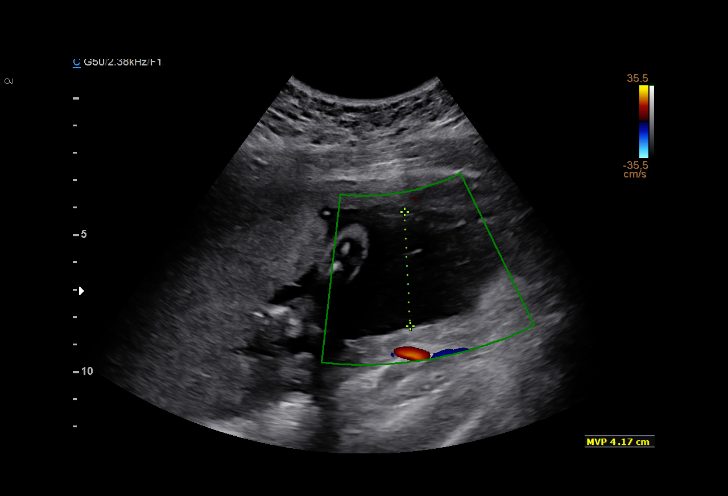
[im 51/51]
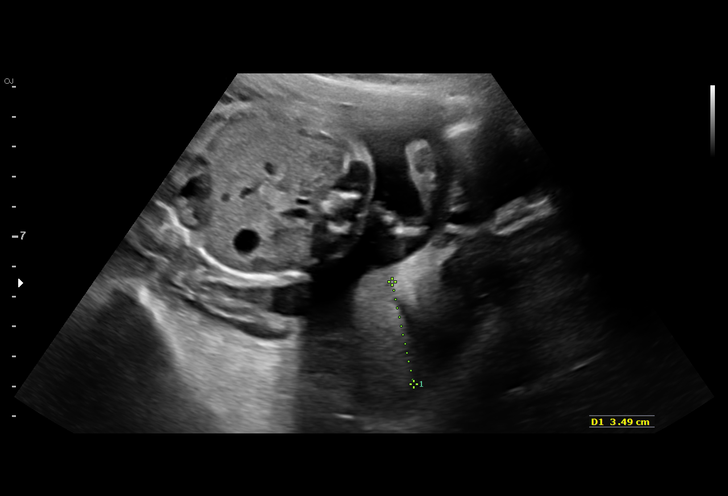

[14 of 28 positions shown; findings below may reference images not displayed]

1  ALPA PAPPAS            [id number]      [id number]     [id number]
Indications

23 weeks gestation of pregnancy
Encounter for other antenatal screening
follow-up
OB History

Blood Type:            Height:  5'9"   Weight (lb):  202      BMI:
Gravidity:    1         Term:   0        Prem:   0        SAB:   0
TOP:          0       Ectopic:  0        Living: 0
Fetal Evaluation

Num Of Fetuses:     1
Fetal Heart         154
Rate(bpm):
Cardiac Activity:   Observed
Presentation:       Breech
Placenta:           Posterior, above cervical os
P. Cord Insertion:  Previously Visualized

Amniotic Fluid
AFI FV:      Subjectively within normal limits

Largest Pocket(cm)
4.17
Biometry

BPD:      56.7  mm     G. Age:  23w 2d         52  %    CI:        76.03   %   70 - 86
FL/HC:      21.3   %   19.2 -
HC:      206.1  mm     G. Age:  22w 5d         21  %    HC/AC:      1.05       1.05 -
AC:      195.5  mm     G. Age:  24w 2d         76  %    FL/BPD:     77.4   %   71 - 87
FL:       43.9  mm     G. Age:  24w 3d         78  %    FL/AC:      22.5   %   20 - 24
CER:      29.3  mm     G. Age:  26w 1d       > 95  %
CM:        4.3  mm

Est. FW:     662  gm      1 lb 7 oz     69  %
Gestational Age

LMP:           23w 1d       Date:   08/28/16                 EDD:   06/04/17
U/S Today:     23w 5d                                        EDD:   05/31/17
Best:          23w 1d    Det. By:   LMP  (08/28/16)          EDD:   06/04/17
Anatomy

Cranium:               Appears normal         Aortic Arch:            Previously seen
Cavum:                 Appears normal         Ductal Arch:            Previously seen
Ventricles:            Appears normal         Diaphragm:              Previously seen
Choroid Plexus:        Previously seen        Stomach:                Appears normal, left
sided
Cerebellum:            Appears normal         Abdomen:                Appears normal
Posterior Fossa:       Appears normal         Abdominal Wall:         Previously seen
Nuchal Fold:           Not applicable (>20    Cord Vessels:           Appears normal (3
wks GA)                                        vessel cord)
Face:                  Orbits and profile     Kidneys:                Appear normal
previously seen
Lips:                  Appears normal         Bladder:                Appears normal
Thoracic:              Appears normal         Spine:                  Limited views
appear normal
Heart:                 Appears normal         Upper Extremities:      Previously seen
(4CH, axis, and situs
RVOT:                  Previously seen        Lower Extremities:      Previously seen
LVOT:                  Previously seen

Other:  Fetus appears to be a female. Heels and 5th digit  prev.visualized.
Open hands and  Nasal bone prev. visualized. Technically difficult
due to fetal position.
Cervix Uterus Adnexa

Cervix
Length:            3.5  cm.
Normal appearance by transabdominal scan.

Uterus
No abnormality visualized.

Left Ovary
Within normal limits.

Right Ovary
Not visualized.

Cul De Sac:   No free fluid seen.
Impression

Single IUP at 23w 1d
Limited views of the spine obtained - appears normal
The remainder of the fetal anatomy appears normal
Posterior placenta without previa
Normal amniotic fluid volume
Recommendations

Follow-up ultrasounds as clinically indicated.
